# Patient Record
Sex: Female | Born: 1940 | Race: White | Hispanic: No | Smoking: Former smoker
Health system: Southern US, Community
[De-identification: ages and names within clinical notes are randomized; demographics above are authoritative.]

---

## 2019-09-23 ENCOUNTER — Inpatient Hospital Stay (HOSPITAL_COMMUNITY)
Admission: EM | Admit: 2019-09-23 | Discharge: 2019-10-01 | DRG: 482 | Disposition: A | Discharge status: Skilled Nursing Facility | Payer: Medicare PPO | Attending: Internal Medicine | Admitting: Internal Medicine

## 2019-09-23 ENCOUNTER — Emergency Department (HOSPITAL_COMMUNITY): Payer: Medicare PPO

## 2019-09-23 ENCOUNTER — Other Ambulatory Visit: Payer: Self-pay

## 2019-09-23 DIAGNOSIS — Z79899 Other long term (current) drug therapy: Secondary | ICD-10-CM

## 2019-09-23 DIAGNOSIS — D72829 Elevated white blood cell count, unspecified: Secondary | ICD-10-CM | POA: Diagnosis not present

## 2019-09-23 DIAGNOSIS — Z20822 Contact with and (suspected) exposure to covid-19: Secondary | ICD-10-CM | POA: Diagnosis present

## 2019-09-23 DIAGNOSIS — J449 Chronic obstructive pulmonary disease, unspecified: Secondary | ICD-10-CM

## 2019-09-23 DIAGNOSIS — W010XXA Fall on same level from slipping, tripping and stumbling without subsequent striking against object, initial encounter: Secondary | ICD-10-CM | POA: Diagnosis present

## 2019-09-23 DIAGNOSIS — S72002A Fracture of unspecified part of neck of left femur, initial encounter for closed fracture: Secondary | ICD-10-CM

## 2019-09-23 DIAGNOSIS — Z87891 Personal history of nicotine dependence: Secondary | ICD-10-CM

## 2019-09-23 DIAGNOSIS — D539 Nutritional anemia, unspecified: Secondary | ICD-10-CM | POA: Diagnosis not present

## 2019-09-23 DIAGNOSIS — Z7951 Long term (current) use of inhaled steroids: Secondary | ICD-10-CM | POA: Diagnosis not present

## 2019-09-23 DIAGNOSIS — I951 Orthostatic hypotension: Secondary | ICD-10-CM | POA: Diagnosis not present

## 2019-09-23 DIAGNOSIS — M25552 Pain in left hip: Secondary | ICD-10-CM | POA: Diagnosis present

## 2019-09-23 DIAGNOSIS — F419 Anxiety disorder, unspecified: Secondary | ICD-10-CM

## 2019-09-23 DIAGNOSIS — S72145A Nondisplaced intertrochanteric fracture of left femur, initial encounter for closed fracture: Principal | ICD-10-CM | POA: Diagnosis present

## 2019-09-23 DIAGNOSIS — Z85048 Personal history of other malignant neoplasm of rectum, rectosigmoid junction, and anus: Secondary | ICD-10-CM

## 2019-09-23 DIAGNOSIS — M810 Age-related osteoporosis without current pathological fracture: Secondary | ICD-10-CM | POA: Diagnosis present

## 2019-09-23 DIAGNOSIS — R197 Diarrhea, unspecified: Secondary | ICD-10-CM | POA: Diagnosis not present

## 2019-09-23 DIAGNOSIS — J431 Panlobular emphysema: Secondary | ICD-10-CM | POA: Diagnosis not present

## 2019-09-23 DIAGNOSIS — Y92009 Unspecified place in unspecified non-institutional (private) residence as the place of occurrence of the external cause: Secondary | ICD-10-CM | POA: Diagnosis not present

## 2019-09-23 DIAGNOSIS — S72009A Fracture of unspecified part of neck of unspecified femur, initial encounter for closed fracture: Secondary | ICD-10-CM | POA: Diagnosis present

## 2019-09-23 DIAGNOSIS — Z8781 Personal history of (healed) traumatic fracture: Secondary | ICD-10-CM

## 2019-09-23 LAB — CBC WITH DIFFERENTIAL/PLATELET
Abs Immature Granulocytes: 0.04 10*3/uL (ref 0.00–0.07)
Basophils Absolute: 0.1 10*3/uL (ref 0.0–0.1)
Basophils Relative: 1 %
Eosinophils Absolute: 0.4 10*3/uL (ref 0.0–0.5)
Eosinophils Relative: 4 %
HCT: 34.6 % — ABNORMAL LOW (ref 36.0–46.0)
Hemoglobin: 10.6 g/dL — ABNORMAL LOW (ref 12.0–15.0)
Immature Granulocytes: 0 %
Lymphocytes Relative: 9 %
Lymphs Abs: 1 10*3/uL (ref 0.7–4.0)
MCH: 30.7 pg (ref 26.0–34.0)
MCHC: 30.6 g/dL (ref 30.0–36.0)
MCV: 100.3 fL — ABNORMAL HIGH (ref 80.0–100.0)
Monocytes Absolute: 0.6 10*3/uL (ref 0.1–1.0)
Monocytes Relative: 6 %
Neutro Abs: 8.9 10*3/uL — ABNORMAL HIGH (ref 1.7–7.7)
Neutrophils Relative %: 80 %
Platelets: 330 10*3/uL (ref 150–400)
RBC: 3.45 MIL/uL — ABNORMAL LOW (ref 3.87–5.11)
RDW: 13.4 % (ref 11.5–15.5)
WBC: 11 10*3/uL — ABNORMAL HIGH (ref 4.0–10.5)
nRBC: 0 % (ref 0.0–0.2)

## 2019-09-23 LAB — BASIC METABOLIC PANEL
Anion gap: 11 (ref 5–15)
BUN: 10 mg/dL (ref 8–23)
CO2: 25 mmol/L (ref 22–32)
Calcium: 9 mg/dL (ref 8.9–10.3)
Chloride: 107 mmol/L (ref 98–111)
Creatinine, Ser: 0.69 mg/dL (ref 0.44–1.00)
GFR calc Af Amer: >60 mL/min (ref >60)
GFR calc non Af Amer: >60 mL/min (ref >60)
Glucose, Bld: 116 mg/dL — ABNORMAL HIGH (ref 70–99)
Potassium: 3.5 mmol/L (ref 3.5–5.1)
Sodium: 143 mmol/L (ref 135–145)

## 2019-09-23 LAB — PROTIME-INR
INR: 0.9 (ref 0.8–1.2)
Prothrombin Time: 11.7 seconds (ref 11.4–15.2)

## 2019-09-23 MED ORDER — FENTANYL CITRATE (PF) 100 MCG/2ML IJ SOLN
100.0000 ug | Freq: Once | INTRAMUSCULAR | Status: AC
  Administered 2019-09-23: 100 ug via INTRAVENOUS
  Filled 2019-09-23: qty 2

## 2019-09-23 MED ORDER — ONDANSETRON HCL 4 MG/2ML IJ SOLN
4.0000 mg | Freq: Once | INTRAMUSCULAR | Status: DC
  Filled 2019-09-23: qty 2

## 2019-09-23 MED ORDER — MORPHINE SULFATE (PF) 2 MG/ML IV SOLN: 0.5000 mg | INTRAVENOUS | Status: DC | PRN

## 2019-09-23 MED ORDER — SODIUM CHLORIDE 0.9 % IV SOLN: INTRAVENOUS | Status: DC

## 2019-09-23 MED ORDER — IBUPROFEN 200 MG PO TABS
600.0000 mg | ORAL_TABLET | Freq: Once | ORAL | Status: AC
  Administered 2019-09-24: 600 mg via ORAL
  Filled 2019-09-23: qty 3

## 2019-09-23 MED ORDER — HYDROCODONE-ACETAMINOPHEN 5-325 MG PO TABS: 1.0000 | ORAL_TABLET | Freq: Four times a day (QID) | ORAL | Status: DC | PRN

## 2019-09-23 MED ORDER — FENTANYL CITRATE (PF) 100 MCG/2ML IJ SOLN: 50.0000 ug | INTRAMUSCULAR | Status: DC | PRN

## 2019-09-23 MED ORDER — HYDROCODONE-ACETAMINOPHEN 5-325 MG PO TABS
1.0000 | ORAL_TABLET | Freq: Four times a day (QID) | ORAL | Status: DC
  Administered 2019-09-24 – 2019-09-29 (×21): 1 via ORAL
  Filled 2019-09-23 (×23): qty 1

## 2019-09-23 MED ORDER — SENNA 8.6 MG PO TABS
1.0000 | ORAL_TABLET | Freq: Two times a day (BID) | ORAL | Status: DC
  Administered 2019-09-24 – 2019-09-27 (×6): 8.6 mg via ORAL
  Filled 2019-09-23 (×6): qty 1

## 2019-09-23 NOTE — ED Notes (Signed)
Report attempted x 1

## 2019-09-23 NOTE — ED Notes (Signed)
Pt. Transported to Xray 

## 2019-09-23 NOTE — ED Triage Notes (Signed)
Pt bib [facility] from home after slipping on a pinecone outside her home. Pt denies hitting head or loc. Pt not on thinners. Pt vs stable.

## 2019-09-23 NOTE — H&P (Signed)
History and Physical    Diana Potts QHU:[id number] DOB: 05-27-1940 DOA: 09/23/2019  PCP: Patient, No Pcp Per  Patient coming from: Home, lives with son who is disabled from stroke  I have personally briefly reviewed patient's old medical records in [facility]  Chief Complaint: Mechanical fall  HPI: Diana Potts is a 79 y.o. female with medical history significant for hx of anal cancer, COPD, osteoporosis, anxiety who presents following a mechanical fall.  Patient was taking her usual walk today when she suddenly felt like her feet rolled on something and she fell on her .  She was otherwise in her normal state of health and denies any dizziness or lightheadedness.  No chest pain or palpitations.  She was unable to get up on her own and laid on the ground about 3 to 4 minutes until she got help from someone driving by. Does not have any hx of recurrent falls. Does not normally need assistance with ambulation.  Mild leukocytosis of 11, macrocytic anemia with hemoglobin of 10.6 and no prior for comparison. BMP otherwise unremarkable.  Pelvic x-ray shows acute intertrochanteric fracture of the left femur with displacement and angulation.  She was given 100 mcg of fentanyl and 4 mg of Zofran in the ED.   Review of Systems:  Constitutional: No Weight Change, No Fever ENT/Mouth: No sore throat, No Rhinorrhea Eyes: No Eye Pain, No Vision Changes Cardiovascular: No Chest Pain, no SOB,  No Palpitations Respiratory: No Cough, No Sputum, Gastrointestinal: No Nausea, No Vomiting, No Diarrhea, No Constipation, No Pain Genitourinary: no Urinary Incontinence Musculoskeletal: No Arthralgias, No Myalgias Skin: No Skin Lesions, No Pruritus, Neuro: no Weakness, No Numbness Psych: No Anxiety/Panic, No Depression, no decrease appetite Heme/Lymph: No Bruising, No Bleeding    Prior to Admission medications   Not on File    Physical Exam: Vitals:   09/23/19 2007 09/23/19 2007 09/23/19  2215 09/23/19 2245  BP:   (!) 142/83 (!) 143/73  Pulse:   63 63  Resp:   16 14  Temp:      TempSrc:      SpO2:  97% 100% 99%  Weight: 52.2 kg     Height: 5\' 6"  (1.676 m)       Constitutional: NAD, calm, comfortable, elderly female lying flat in bed Vitals:   09/23/19 2007 09/23/19 2007 09/23/19 2215 09/23/19 2245  BP:   (!) 142/83 (!) 143/73  Pulse:   63 63  Resp:   16 14  Temp:      TempSrc:      SpO2:  97% 100% 99%  Weight: 52.2 kg     Height: 5\' 6"  (1.676 m)      Eyes: PERRL, lids and conjunctivae normal ENMT: Mucous membranes are moist. Neck: normal, supple Respiratory: clear to auscultation bilaterally, no wheezing, no crackles. Normal respiratory effort. No accessory muscle use.  Cardiovascular: Regular rate and rhythm, no murmurs / rubs / gallops. No extremity edema. 2+ pedal pulses.  Abdomen: no tenderness, no masses palpated.  Bowel sounds positive.  Musculoskeletal: no clubbing / cyanosis. No joint deformity upper and lower extremities.  Externally rotated and shortened left lower extremity compared to the right. Skin: no rashes, lesions, ulcers. No induration Neurologic: CN 2-12 grossly intact. Sensation intact.  5 out of 5 strength of the right lower extremity but unable to left left lower extremity due to pain tolerance. Psychiatric: Normal judgment and insight. Alert and oriented x 3. Normal mood.    Labs on  Admission: I have personally reviewed following labs and imaging studies  CBC: Recent Labs  Lab 09/23/19 2141  WBC 11.0*  NEUTROABS 8.9*  HGB 10.6*  HCT 34.6*  MCV 100.3*  PLT 330   Basic Metabolic Panel: Recent Labs  Lab 09/23/19 2141  NA 143  K 3.5  CL 107  CO2 25  GLUCOSE 116*  BUN 10  CREATININE 0.69  CALCIUM 9.0   GFR: Estimated Creatinine Clearance: 47.8 mL/min (by C-G formula based on SCr of 0.69 mg/dL). Liver Function Tests: No results for input(s): AST, ALT, ALKPHOS, BILITOT, PROT, ALBUMIN in the last 168 hours. No results  for input(s): LIPASE, AMYLASE in the last 168 hours. No results for input(s): AMMONIA in the last 168 hours. Coagulation Profile: Recent Labs  Lab 09/23/19 2141  INR 0.9   Cardiac Enzymes: No results for input(s): CKTOTAL, CKMB, CKMBINDEX, TROPONINI in the last 168 hours. BNP (last 3 results) No results for input(s): PROBNP in the last 8760 hours. HbA1C: No results for input(s): HGBA1C in the last 72 hours. CBG: No results for input(s): GLUCAP in the last 168 hours. Lipid Profile: No results for input(s): CHOL, HDL, LDLCALC, TRIG, CHOLHDL, LDLDIRECT in the last 72 hours. Thyroid Function Tests: No results for input(s): TSH, T4TOTAL, FREET4, T3FREE, THYROIDAB in the last 72 hours. Anemia Panel: No results for input(s): VITAMINB12, FOLATE, FERRITIN, TIBC, IRON, RETICCTPCT in the last 72 hours. Urine analysis: No results found for: COLORURINE, APPEARANCEUR, LABSPEC, PHURINE, GLUCOSEU, HGBUR, BILIRUBINUR, KETONESUR, PROTEINUR, UROBILINOGEN, NITRITE, LEUKOCYTESUR  Radiological Exams on Admission: DG Hip Unilat W or Wo Pelvis 2-3 Views Left  Result Date: 09/23/2019 CLINICAL DATA:  Fall, hip pain EXAM: DG HIP (WITH OR WITHOUT PELVIS) 2-3V LEFT COMPARISON:  None. FINDINGS: Acute intertrochanteric fracture of the left femur with displacement and varus angulation. No dislocation. IMPRESSION: Acute intertrochanteric fracture of the left femur with displacement and angulation. Electronically Signed   By: Guadlupe Spanish M.D.   On: 09/23/2019 20:53      Assessment/Plan  Left intertrochanteric fracture s/p mechanical fall ortho to take for IM nailing tomorrow Keep NPO Scheduled hydrocodone-acetaminophen, PRN IV morphine for severe pain   Macrocytic anemia Hemoglobin 10.6 with no prior for comparison Check iron panel, vitamin B12 and folate Patient denies any dark stools but does have a history of anal cancer.  Will check FOBT  COPD Not in acute exacerbation.  Continue  bronchodilator.  Anxiety Continue Prozac  DVT prophylaxis: SCDs Code Status: Full Family Communication: Plan discussed with patient at bedside  disposition Plan: Home with at least 2 midnight stays  Consults called: Ortho Admission status: inpatient  Status is: Inpatient  Remains inpatient appropriate because:Inpatient level of care appropriate due to severity of illness   Dispo: The patient is from: Home              Anticipated d/c is to: TBD              Anticipated d/c date is: 3 days              Patient currently is not medically stable to d/c.         Anselm Jungling DO [facility]   If 7PM-7AM, please contact night-coverage [email or web address]   09/23/2019, 11:55 PM

## 2019-09-23 NOTE — Consult Note (Signed)
I have reviewed imaging. Plan for IM nail L hip in the morning after medical work up.   Formal consult to follow.    Sheral Apley

## 2019-09-24 ENCOUNTER — Encounter (HOSPITAL_COMMUNITY): Payer: Self-pay | Admitting: Family Medicine

## 2019-09-24 ENCOUNTER — Inpatient Hospital Stay (HOSPITAL_COMMUNITY): Payer: Medicare PPO | Admitting: Certified Registered"

## 2019-09-24 ENCOUNTER — Inpatient Hospital Stay (HOSPITAL_COMMUNITY): Payer: Medicare PPO

## 2019-09-24 ENCOUNTER — Encounter (HOSPITAL_COMMUNITY)
Admission: EM | Disposition: A | Discharge status: Skilled Nursing Facility | Payer: Self-pay | Source: Home / Self Care | Attending: Internal Medicine

## 2019-09-24 DIAGNOSIS — J449 Chronic obstructive pulmonary disease, unspecified: Secondary | ICD-10-CM

## 2019-09-24 DIAGNOSIS — J431 Panlobular emphysema: Secondary | ICD-10-CM

## 2019-09-24 DIAGNOSIS — F419 Anxiety disorder, unspecified: Secondary | ICD-10-CM

## 2019-09-24 DIAGNOSIS — D539 Nutritional anemia, unspecified: Secondary | ICD-10-CM

## 2019-09-24 HISTORY — PX: INTRAMEDULLARY (IM) NAIL INTERTROCHANTERIC: SHX5875

## 2019-09-24 LAB — FOLATE: Folate: 13.2 ng/mL (ref >5.9)

## 2019-09-24 LAB — IRON AND TIBC
Iron: 72 ug/dL (ref 28–170)
Saturation Ratios: 25 % (ref 10.4–31.8)
TIBC: 284 ug/dL (ref 250–450)
UIBC: 212 ug/dL

## 2019-09-24 LAB — SARS CORONAVIRUS 2 BY RT PCR (HOSPITAL ORDER, PERFORMED IN ~~LOC~~ HOSPITAL LAB): SARS Coronavirus 2: NEGATIVE

## 2019-09-24 LAB — TYPE AND SCREEN
ABO/RH(D): A POS
ABO/RH(D): A POS
Antibody Screen: NEGATIVE
Antibody Screen: NEGATIVE

## 2019-09-24 LAB — MRSA PCR SCREENING: MRSA by PCR: NEGATIVE

## 2019-09-24 LAB — VITAMIN B12: Vitamin B-12: 6371 pg/mL — ABNORMAL HIGH (ref 180–914)

## 2019-09-24 SURGERY — FIXATION, FRACTURE, INTERTROCHANTERIC, WITH INTRAMEDULLARY ROD
Anesthesia: Spinal | Site: Hip | Laterality: Left

## 2019-09-24 MED ORDER — MENTHOL 3 MG MT LOZG
1.0000 | LOZENGE | OROMUCOSAL | Status: DC | PRN
  Administered 2019-09-24: 3 mg via ORAL
  Filled 2019-09-24 (×2): qty 9

## 2019-09-24 MED ORDER — TRANEXAMIC ACID-NACL 1000-0.7 MG/100ML-% IV SOLN
INTRAVENOUS | Status: AC
  Filled 2019-09-24: qty 100

## 2019-09-24 MED ORDER — PHENYLEPHRINE HCL-NACL 10-0.9 MG/250ML-% IV SOLN
INTRAVENOUS | Status: DC | PRN
  Administered 2019-09-24: 20 ug/min via INTRAVENOUS

## 2019-09-24 MED ORDER — CEFAZOLIN SODIUM-DEXTROSE 2-4 GM/100ML-% IV SOLN
2.0000 g | Freq: Once | INTRAVENOUS | Status: AC
  Administered 2019-09-24: 2 g via INTRAVENOUS

## 2019-09-24 MED ORDER — CEFAZOLIN SODIUM-DEXTROSE 2-4 GM/100ML-% IV SOLN
INTRAVENOUS | Status: AC
  Filled 2019-09-24: qty 100

## 2019-09-24 MED ORDER — FENTANYL CITRATE (PF) 250 MCG/5ML IJ SOLN
INTRAMUSCULAR | Status: DC | PRN
  Administered 2019-09-24: 50 ug via INTRAVENOUS

## 2019-09-24 MED ORDER — ONDANSETRON HCL 4 MG/2ML IJ SOLN: 4.0000 mg | Freq: Once | INTRAMUSCULAR | Status: DC | PRN

## 2019-09-24 MED ORDER — MIDAZOLAM HCL 2 MG/2ML IJ SOLN
INTRAMUSCULAR | Status: AC
  Filled 2019-09-24: qty 2

## 2019-09-24 MED ORDER — SODIUM CHLORIDE 0.9 % IR SOLN
Status: DC | PRN
  Administered 2019-09-24: 1000 mL

## 2019-09-24 MED ORDER — LACTATED RINGERS IV SOLN: INTRAVENOUS | Status: DC

## 2019-09-24 MED ORDER — LORAZEPAM 0.5 MG PO TABS
0.2500 mg | ORAL_TABLET | Freq: Two times a day (BID) | ORAL | Status: DC | PRN
  Administered 2019-09-27 – 2019-09-30 (×2): 0.25 mg via ORAL
  Filled 2019-09-24 (×2): qty 1

## 2019-09-24 MED ORDER — MOMETASONE FURO-FORMOTEROL FUM 200-5 MCG/ACT IN AERO
2.0000 | INHALATION_SPRAY | Freq: Two times a day (BID) | RESPIRATORY_TRACT | Status: DC
  Administered 2019-09-24 – 2019-10-01 (×13): 2 via RESPIRATORY_TRACT
  Filled 2019-09-24: qty 8.8

## 2019-09-24 MED ORDER — PROPOFOL 500 MG/50ML IV EMUL
INTRAVENOUS | Status: DC | PRN
Start: 2019-09-24 — End: 2019-09-24
  Administered 2019-09-24: 25 ug/kg/min via INTRAVENOUS

## 2019-09-24 MED ORDER — ALBUTEROL SULFATE (2.5 MG/3ML) 0.083% IN NEBU: 3.0000 mL | INHALATION_SOLUTION | Freq: Four times a day (QID) | RESPIRATORY_TRACT | Status: DC | PRN

## 2019-09-24 MED ORDER — MIDAZOLAM HCL 2 MG/2ML IJ SOLN
INTRAMUSCULAR | Status: DC | PRN
  Administered 2019-09-24: 1 mg via INTRAVENOUS

## 2019-09-24 MED ORDER — FLUOXETINE HCL 10 MG PO CAPS
10.0000 mg | ORAL_CAPSULE | Freq: Every day | ORAL | Status: DC
  Administered 2019-09-25 – 2019-09-30 (×6): 10 mg via ORAL
  Filled 2019-09-24 (×8): qty 1

## 2019-09-24 MED ORDER — PHENYLEPHRINE 40 MCG/ML (10ML) SYRINGE FOR IV PUSH (FOR BLOOD PRESSURE SUPPORT)
PREFILLED_SYRINGE | INTRAVENOUS | Status: DC | PRN
  Administered 2019-09-24 (×2): 80 ug via INTRAVENOUS

## 2019-09-24 MED ORDER — VITAMIN D 25 MCG (1000 UNIT) PO TABS
2000.0000 [IU] | ORAL_TABLET | Freq: Every day | ORAL | Status: DC
  Administered 2019-09-25 – 2019-10-01 (×7): 2000 [IU] via ORAL
  Filled 2019-09-24 (×7): qty 2

## 2019-09-24 MED ORDER — FENTANYL CITRATE (PF) 100 MCG/2ML IJ SOLN: 25.0000 ug | INTRAMUSCULAR | Status: DC | PRN

## 2019-09-24 MED ORDER — TRANEXAMIC ACID-NACL 1000-0.7 MG/100ML-% IV SOLN
INTRAVENOUS | Status: DC | PRN
Start: 2019-09-24 — End: 2019-09-24
  Administered 2019-09-24: 1000 mg via INTRAVENOUS

## 2019-09-24 MED ORDER — IBUPROFEN 200 MG PO TABS
600.0000 mg | ORAL_TABLET | Freq: Four times a day (QID) | ORAL | Status: DC | PRN
  Administered 2019-09-24 – 2019-09-29 (×4): 600 mg via ORAL
  Filled 2019-09-24 (×5): qty 3

## 2019-09-24 MED ORDER — CHLORHEXIDINE GLUCONATE 0.12 % MT SOLN
15.0000 mL | Freq: Once | OROMUCOSAL | Status: AC
  Administered 2019-09-24: 15 mL via OROMUCOSAL

## 2019-09-24 MED ORDER — PROPOFOL 10 MG/ML IV BOLUS
INTRAVENOUS | Status: DC | PRN
  Administered 2019-09-24 (×2): 20 mg via INTRAVENOUS
  Administered 2019-09-24: 10 mg via INTRAVENOUS

## 2019-09-24 MED ORDER — PROPOFOL 10 MG/ML IV BOLUS
INTRAVENOUS | Status: AC
  Filled 2019-09-24: qty 20

## 2019-09-24 MED ORDER — MEPERIDINE HCL 25 MG/ML IJ SOLN: 6.2500 mg | INTRAMUSCULAR | Status: DC | PRN

## 2019-09-24 MED ORDER — FENTANYL CITRATE (PF) 250 MCG/5ML IJ SOLN
INTRAMUSCULAR | Status: AC
  Filled 2019-09-24: qty 5

## 2019-09-24 MED ORDER — BUPIVACAINE IN DEXTROSE 0.75-8.25 % IT SOLN
INTRATHECAL | Status: DC | PRN
Start: 2019-09-24 — End: 2019-09-24
  Administered 2019-09-24: 1.6 mL via INTRATHECAL

## 2019-09-24 MED ORDER — MONTELUKAST SODIUM 10 MG PO TABS
10.0000 mg | ORAL_TABLET | Freq: Every day | ORAL | Status: DC
  Administered 2019-09-24 – 2019-09-30 (×7): 10 mg via ORAL
  Filled 2019-09-24 (×7): qty 1

## 2019-09-24 SURGICAL SUPPLY — 39 items
BIT DRILL AO GAMMA 4.2X180 (BIT) ×3 IMPLANT
CLOSURE STERI-STRIP 1/2X4 (GAUZE/BANDAGES/DRESSINGS) ×1
CLOSURE WOUND 1/2 X4 (GAUZE/BANDAGES/DRESSINGS) ×1
CLSR STERI-STRIP ANTIMIC 1/2X4 (GAUZE/BANDAGES/DRESSINGS) ×2 IMPLANT
COVER MAYO STAND STRL (DRAPES) ×3 IMPLANT
COVER PERINEAL POST (MISCELLANEOUS) ×3 IMPLANT
COVER SURGICAL LIGHT HANDLE (MISCELLANEOUS) ×3 IMPLANT
COVER WAND RF STERILE (DRAPES) IMPLANT
DRAPE INCISE IOBAN 66X45 STRL (DRAPES) ×3 IMPLANT
DRAPE STERI IOBAN 125X83 (DRAPES) ×3 IMPLANT
DRSG MEPILEX BORDER 4X4 (GAUZE/BANDAGES/DRESSINGS) ×6 IMPLANT
DURAPREP 26ML APPLICATOR (WOUND CARE) ×3 IMPLANT
ELECT REM PT RETURN 9FT ADLT (ELECTROSURGICAL) ×3
ELECTRODE REM PT RTRN 9FT ADLT (ELECTROSURGICAL) ×1 IMPLANT
GLOVE BIO SURGEON STRL SZ7.5 (GLOVE) ×6 IMPLANT
GLOVE BIOGEL PI IND STRL 8 (GLOVE) ×2 IMPLANT
GLOVE BIOGEL PI INDICATOR 8 (GLOVE) ×4
GOWN STRL REUS W/ TWL LRG LVL3 (GOWN DISPOSABLE) ×2 IMPLANT
GOWN STRL REUS W/TWL LRG LVL3 (GOWN DISPOSABLE) ×4
GUIDEROD T2 3X1000 (ROD) ×3 IMPLANT
K-WIRE  3.2X450M STR (WIRE) ×4
K-WIRE 3.2X450M STR (WIRE) ×2
KIT BASIN OR (CUSTOM PROCEDURE TRAY) ×3 IMPLANT
KIT TURNOVER KIT B (KITS) ×3 IMPLANT
KWIRE 3.2X450M STR (WIRE) ×2 IMPLANT
MANIFOLD NEPTUNE II (INSTRUMENTS) IMPLANT
NAIL LONG RT 1.5 10X340-125 (Nail) ×3 IMPLANT
NS IRRIG 1000ML POUR BTL (IV SOLUTION) ×3 IMPLANT
PACK GENERAL/GYN (CUSTOM PROCEDURE TRAY) ×3 IMPLANT
PAD ARMBOARD 7.5X6 YLW CONV (MISCELLANEOUS) ×6 IMPLANT
SCREW LAG GAMMA 3 95MM (Screw) ×3 IMPLANT
STRIP CLOSURE SKIN 1/2X4 (GAUZE/BANDAGES/DRESSINGS) ×2 IMPLANT
SUT MNCRL AB 4-0 PS2 18 (SUTURE) ×3 IMPLANT
SUT MON AB 2-0 CT1 36 (SUTURE) ×3 IMPLANT
SUT VIC AB 2-0 CT1 27 (SUTURE) ×2
SUT VIC AB 2-0 CT1 TAPERPNT 27 (SUTURE) ×1 IMPLANT
TOWEL GREEN STERILE (TOWEL DISPOSABLE) ×3 IMPLANT
TOWEL OR NON WOVEN STRL DISP B (DISPOSABLE) IMPLANT
WATER STERILE IRR 1000ML POUR (IV SOLUTION) ×3 IMPLANT

## 2019-09-24 NOTE — Op Note (Signed)
DATE OF SURGERY:  09/24/2019  TIME: 10:47 AM  PATIENT NAME:  Diana Potts  AGE: 79 y.o.  PRE-OPERATIVE DIAGNOSIS:  LEFT INTERTROCHANTERIC FRACTURE  POST-OPERATIVE DIAGNOSIS:  SAME  PROCEDURE:  INTRAMEDULLARY (IM) NAIL, HIP  SURGEON:  Sheral Apley  ASSISTANT:  none.   OPERATIVE IMPLANTS: Stryker Gamma Nail  PREOPERATIVE INDICATIONS:  [name] is a 79 y.o. year old who fell and suffered a hip fracture. She was brought into the ER and then admitted and optimized and then elected for surgical intervention.    The risks benefits and alternatives were discussed with the patient including but not limited to the risks of nonoperative treatment, versus surgical intervention including infection, bleeding, nerve injury, malunion, nonunion, hardware prominence, hardware failure, need for hardware removal, blood clots, cardiopulmonary complications, morbidity, mortality, among others, and they were willing to proceed.    OPERATIVE PROCEDURE:  The patient was brought to the operating room and placed in the supine position. General anesthesia was administered. She was placed on the fracture table.  Closed reduction was performed under C-arm guidance. Time out was then performed after sterile prep and drape. She received preoperative antibiotics.  Incision was made proximal to the greater trochanter. A guidewire was placed in the appropriate position. Confirmation was made on AP and lateral views. The above-named nail was opened. I opened the proximal femur with a reamer. I then placed the nail by hand easily down. I did not need to ream the femur.  Once the nail was completely seated, I placed a guidepin into the femoral head into the center center position. I measured the length, and then reamed the lateral cortex and up into the head. I then placed the lag screw. Slight compression was applied. Anatomic fixation achieved. Bone quality was mediocre.  I then secured the proximal  interlocking bolt, and took off a half a turn, and then removed the instruments, and took final C-arm pictures AP and lateral the entire length of the leg.   Anatomic reconstruction was achieved, and the wounds were irrigated copiously and closed with Vicryl followed by staples and sterile gauze for the skin. The patient was awakened and returned to PACU in stable and satisfactory condition. There no complications and the patient tolerated the procedure well.  She will be weightbearing as tolerated, and will be on chemical px  for a period of four weeks after discharge.   Margarita Rana, M.D.

## 2019-09-24 NOTE — Anesthesia Preprocedure Evaluation (Signed)
Anesthesia Evaluation  Patient identified by MRN, date of birth, ID band Patient awake    Reviewed: Allergy & Precautions, NPO status , Patient's Chart, lab work & pertinent test results  Airway Mallampati: II  TM Distance: >3 FB Neck ROM: Full    Dental no notable dental hx. (+) Dental Advisory Given, Teeth Intact   Pulmonary COPD, former smoker,    Pulmonary exam normal breath sounds clear to auscultation       Cardiovascular negative cardio ROS Normal cardiovascular exam Rhythm:Regular Rate:Normal     Neuro/Psych negative neurological ROS  negative psych ROS   GI/Hepatic negative GI ROS, Neg liver ROS,   Endo/Other  negative endocrine ROS  Renal/GU negative Renal ROS     Musculoskeletal negative musculoskeletal ROS (+)   Abdominal   Peds  Hematology negative hematology ROS (+) anemia ,   Anesthesia Other Findings   Reproductive/Obstetrics negative OB ROS                             Anesthesia Physical Anesthesia Plan  ASA: III  Anesthesia Plan: Spinal   Post-op Pain Management:    Induction: Intravenous  PONV Risk Score and Plan: 3 and Propofol infusion, TIVA and Treatment may vary due to age or medical condition  Airway Management Planned: Natural Airway  Additional Equipment: None  Intra-op Plan:   Post-operative Plan:   Informed Consent: I have reviewed the patients History and Physical, chart, labs and discussed the procedure including the risks, benefits and alternatives for the proposed anesthesia with the patient or authorized representative who has indicated his/her understanding and acceptance.     Dental advisory given  Plan Discussed with: CRNA  Anesthesia Plan Comments:         Anesthesia Quick Evaluation

## 2019-09-24 NOTE — Anesthesia Procedure Notes (Signed)
Procedure Name: MAC Date/Time: 09/24/2019 9:37 AM Performed by: Amadeo Garnet, CRNA Pre-anesthesia Checklist: Patient identified, Emergency Drugs available, Suction available and Patient being monitored Patient Re-evaluated:Patient Re-evaluated prior to induction Oxygen Delivery Method: Simple face mask Preoxygenation: Pre-oxygenation with 100% oxygen Induction Type: IV induction Placement Confirmation: positive ETCO2 Dental Injury: Teeth and Oropharynx as per pre-operative assessment

## 2019-09-24 NOTE — Transfer of Care (Signed)
Immediate Anesthesia Transfer of Care Note  Patient: Diana Potts  Procedure(s) Performed: INTRAMEDULLARY (IM) NAIL, HIP (Left Hip)  Patient Location: PACU  Anesthesia Type:Spinal  Level of Consciousness: awake, alert  and oriented  Airway & Oxygen Therapy: Patient Spontanous Breathing  Post-op Assessment: Report given to RN and Post -op Vital signs reviewed and stable  Post vital signs: Reviewed and stable  Last Vitals:  Vitals Value Taken Time  BP 105/89 09/24/19 1050  Temp    Pulse 93 09/24/19 1052  Resp 16 09/24/19 1052  SpO2 99 % 09/24/19 1052  Vitals shown include unvalidated device data.  Last Pain:  Vitals:   09/24/19 0356  TempSrc: Oral  PainSc:          Complications: No complications documented.

## 2019-09-24 NOTE — ED Provider Notes (Signed)
[facility] Provider Note   CSN: [id number] Arrival date & time: 09/23/19  1856     History Chief Complaint  Patient presents with  . Hip Pain    left     Diana Potts is a 79 y.o. female.  HPI      79yo female presents with concern for fall and hip pain.  Reports walking outside and believes she slipped on a pine cone and fell.  Fell directly on left hip with severe pain. Pain 10/10 with movement. 2/10 at rest.  Denies head trauma, headache, neck pain, back pain, numbness/weakness. Not on anticoagulation.  No other medical concerns.  History reviewed. No pertinent past medical history.  Patient Active Problem List   Diagnosis Date Noted  . Macrocytic anemia 09/24/2019  . COPD (chronic obstructive pulmonary disease) (HCC) 09/24/2019  . Anxiety 09/24/2019  . Hip fracture (HCC) 09/23/2019     OB History   No obstetric history on file.     No family history on file.  Social History   Tobacco Use  . Smoking status: Former Games developer  . Smokeless tobacco: Never Used  Substance Use Topics  . Alcohol use: Not Currently  . Drug use: Not on file    Home Medications Prior to Admission medications   Medication Sig Start Date End Date Taking? Authorizing Provider  albuterol (VENTOLIN HFA) 108 (90 Base) MCG/ACT inhaler Inhale 1 puff into the lungs every 6 (six) hours as needed for wheezing or shortness of breath. 09/23/19  Yes [provider]  ALPHA-LIPOIC ACID PO Take 2 capsules by mouth daily.   Yes [provider]  Aspirin-Salicylamide-Caffeine (BC HEADACHE POWDER PO) Take 0.5 Packages by mouth daily as needed (Pain).   Yes [provider]  Cholecalciferol (VITAMIN D3 PO) Take 2,000-3,000 Units by mouth daily. Vitamin D3 Liquid   Yes [provider]  Co-Enzyme Q-10 100 MG CAPS Take 100 mg by mouth daily.   Yes [provider]  COLLAGEN PO Take 3 capsules by mouth daily.   Yes [provider]   Digestive Enzyme CAPS Take 1 capsule by mouth daily.   Yes [provider]  dorzolamide-timolol (COSOPT) 22.3-6.8 MG/ML ophthalmic solution Place 1 drop into both eyes 2 (two) times daily. 09/19/19  Yes [provider]  FLUoxetine (PROZAC) 10 MG capsule Take 10 mg by mouth at bedtime.   Yes [provider]  fluticasone (FLONASE) 50 MCG/ACT nasal spray Place 2 sprays into both nostrils daily as needed for allergies. 06/23/19  Yes [provider]  latanoprost (XALATAN) 0.005 % ophthalmic solution Place 1 drop into both eyes at bedtime. 09/06/19  Yes [provider]  LORazepam (ATIVAN) 0.5 MG tablet Take 0.25 mg by mouth 2 (two) times daily as needed for anxiety or sleep. 08/19/19  Yes [provider]  montelukast (SINGULAIR) 10 MG tablet Take 10 mg by mouth at bedtime. 04/19/19  Yes [provider]  phenylephrine (NEO-SYNEPHRINE) 1 % nasal spray Place 1 drop into both nostrils every 6 (six) hours as needed for congestion.   Yes [provider]  SYMBICORT 160-4.5 MCG/ACT inhaler Inhale 2 puffs into the lungs 2 (two) times daily. 09/23/19  Yes [provider]    Allergies    Patient has no known allergies.  Review of Systems   Review of Systems  Constitutional: Negative for fever.  HENT: Negative for sore throat.   Eyes: Negative for visual disturbance.  Respiratory: Negative for cough and shortness of  breath.   Cardiovascular: Negative for chest pain.  Gastrointestinal: Negative for abdominal pain, nausea and vomiting.  Genitourinary: Negative for difficulty urinating.  Musculoskeletal: Positive for arthralgias. Negative for back pain and neck pain.  Skin: Negative for rash.  Neurological: Negative for syncope, weakness, numbness and headaches.    Physical Exam Updated Vital Signs BP 116/60 (BP Location: Left Arm)   Pulse 67   Temp 98.4 F (36.9 C) (Oral)   Resp 16   Ht 5\' 6"  (1.676 m)   Wt 52.2 kg   SpO2  97%   BMI 18.56 kg/m   Physical Exam Vitals and nursing note reviewed.  Constitutional:      General: She is not in acute distress.    Appearance: She is well-developed. She is not diaphoretic.  HENT:     Head: Normocephalic and atraumatic.  Eyes:     Conjunctiva/sclera: Conjunctivae normal.  Cardiovascular:     Rate and Rhythm: Normal rate and regular rhythm.     Heart sounds: Normal heart sounds. No murmur heard.  No friction rub. No gallop.   Pulmonary:     Effort: Pulmonary effort is normal. No respiratory distress.     Breath sounds: Normal breath sounds. No wheezing or rales.  Abdominal:     General: There is no distension.     Palpations: Abdomen is soft.     Tenderness: There is no abdominal tenderness. There is no guarding.  Musculoskeletal:        General: Tenderness (decreased ROM L hip, normal movement of foot, normal sensation and pulses) present.     Cervical back: Normal range of motion.     Comments: Holding left leg in flexion  Skin:    General: Skin is warm and dry.     Findings: No erythema or rash.  Neurological:     Mental Status: She is alert and oriented to person, place, and time.     ED Results / Procedures / Treatments   Labs (all labs ordered are listed, but only abnormal results are displayed) Labs Reviewed  BASIC METABOLIC PANEL - Abnormal; Notable for the following components:      Result Value   Glucose, Bld 116 (*)    All other components within normal limits  CBC WITH DIFFERENTIAL/PLATELET - Abnormal; Notable for the following components:   WBC 11.0 (*)    RBC 3.45 (*)    Hemoglobin 10.6 (*)    HCT 34.6 (*)    MCV 100.3 (*)    Neutro Abs 8.9 (*)    All other components within normal limits  SARS CORONAVIRUS 2 BY RT PCR (HOSPITAL ORDER, PERFORMED IN [facility])  MRSA PCR SCREENING  PROTIME-INR  VITAMIN B12  FOLATE  IRON AND TIBC  OCCULT BLOOD X 1 CARD TO LAB, STOOL  TYPE AND SCREEN  TYPE AND SCREEN     EKG EKG Interpretation  Date/Time:  Friday September 23 2019 21:51:15 EDT Ventricular Rate:  63 PR Interval:    QRS Duration: 97 QT Interval:  416 QTC Calculation: 426 R Axis:   79 Text Interpretation: Sinus rhythm Normal ECG No old tracing to compare Confirmed by [id number] (Dione Booze) on 09/24/2019 12:12:29 AM   Radiology DG Hip Unilat W or Wo Pelvis 2-3 Views Left  Result Date: 09/23/2019 CLINICAL DATA:  Fall, hip pain EXAM: DG HIP (WITH OR WITHOUT PELVIS) 2-3V LEFT COMPARISON:  None. FINDINGS: Acute intertrochanteric fracture of the left femur with displacement and varus angulation. No  dislocation. IMPRESSION: Acute intertrochanteric fracture of the left femur with displacement and angulation. Electronically Signed   By: Guadlupe Spanish M.D.   On: 09/23/2019 20:53    Procedures Procedures (including critical care time)  Medications Ordered in ED Medications  ondansetron (ZOFRAN) injection 4 mg ( Intravenous MAR Hold 09/24/19 0743)  0.9 %  sodium chloride infusion ( Intravenous Rate/Dose Change 09/24/19 0100)  morphine 2 MG/ML injection 0.5 mg ( Intravenous MAR Hold 09/24/19 0743)  senna (SENOKOT) tablet 8.6 mg ( Oral Automatically Held 10/09/19 2200)  HYDROcodone-acetaminophen (NORCO/VICODIN) 5-325 MG per tablet 1 tablet ( Oral Automatically Held 10/02/19 1800)  FLUoxetine (PROZAC) capsule 10 mg ( Oral Automatically Held 10/02/19 2200)  LORazepam (ATIVAN) tablet 0.25 mg ( Oral MAR Hold 09/24/19 0743)  montelukast (SINGULAIR) tablet 10 mg ( Oral Automatically Held 10/02/19 2200)  mometasone-formoterol (DULERA) 200-5 MCG/ACT inhaler 2 puff ( Inhalation Automatically Held 10/02/19 2000)  albuterol (PROVENTIL) (2.5 MG/3ML) 0.083% nebulizer solution 3 mL ( Inhalation MAR Hold 09/24/19 0743)  cholecalciferol (VITAMIN D3) tablet 2,000 Units ( Oral Automatically Held 10/02/19 1000)  ceFAZolin (ANCEF) IVPB 2g/100 mL premix (2 g Intravenous Given 09/24/19 0938)  lactated ringers infusion ( Intravenous  Restarted 09/24/19 0925)  fentaNYL (SUBLIMAZE) injection 100 mcg (100 mcg Intravenous Given 09/23/19 2018)  ibuprofen (ADVIL) tablet 600 mg (600 mg Oral Given 09/24/19 0054)  chlorhexidine (PERIDEX) 0.12 % solution 15 mL (15 mLs Mouth/Throat Given 09/24/19 0804)  ceFAZolin (ANCEF) 2-4 GM/100ML-% IVPB (  Override pull for Anesthesia 09/24/19 0938)  midazolam (VERSED) 2 MG/2ML injection (  Override pull for Anesthesia 09/24/19 0933)    ED Course  I have reviewed the triage vital signs and the nursing notes.  Pertinent labs & imaging results that were available during my care of the patient were reviewed by me and considered in my medical decision making (see chart for details).    MDM Rules/Calculators/A&P                          79yo female presents with concern for fall and hip pain. No head trauma, no headache, not on anticoagulation, no neck or back pain, doubt intracranial or cervical spine injury.  Denies other injuries or medical concerns.  NV intact.  XR shows left intertrochanteric hip fracture. Consulted Dr. Eulah Pont of Orthopedics who plans on surgery for her tomorrow.  Consulted hospitalist for admission.    Final Clinical Impression(s) / ED Diagnoses Final diagnoses:  Closed nondisplaced intertrochanteric fracture of left femur, initial encounter [facility])    Rx / DC Orders ED Discharge Orders    None       Alvira Monday, MD 09/24/19 1005

## 2019-09-24 NOTE — Progress Notes (Signed)
Pt arrived from PACU accompanied by staff, Pt alert and oriented in no apparent distress. No complaints voiced. Pt situated to room call bell/room phone within reach.  3 side rails up and bed in lowest position and all wheels locked.

## 2019-09-24 NOTE — Plan of Care (Signed)
  Problem: Education: Goal: Knowledge of General Education information will improve Description: Including pain rating scale, medication(s)/side effects and non-pharmacologic comfort measures Outcome: Progressing   Problem: Health Behavior/Discharge Planning: Goal: Ability to manage health-related needs will improve Outcome: Progressing   Problem: Activity: Goal: Risk for activity intolerance will decrease Outcome: Progressing   

## 2019-09-24 NOTE — Consult Note (Signed)
   ORTHOPAEDIC CONSULTATION  REQUESTING PHYSICIAN: Ghimire, Kuber, MD  Chief Complaint: left intertroch fracture  HPI: Diana Potts is a 79 y.o. female who complains of Hip pain. I have reviewed and agree with below history  Diana Remigio is a 79 y.o. female with medical history significant for hx of anal cancer, COPD, osteoporosis, anxiety who presents following a mechanical fall.  Patient was taking her usual walk today when she suddenly felt like her feet rolled on something and she fell on her .  She was otherwise in her normal state of health and denies any dizziness or lightheadedness.  No chest pain or palpitations.  She was unable to get up on her own and laid on the ground about 3 to 4 minutes until she got help from someone driving by. Does not have any hx of recurrent falls. Does not normally need assistance with ambulation.  Mild leukocytosis of 11, macrocytic anemia with hemoglobin of 10.6 and no prior for comparison. BMP otherwise unremarkable.  Pelvic x-ray shows acute intertrochanteric fracture of the left femur with displacement and angulation.  History reviewed. No pertinent past medical history.  Social History   Socioeconomic History  . Marital status: Widowed    Spouse name: Not on file  . Number of children: Not on file  . Years of education: Not on file  . Highest education level: Not on file  Occupational History  . Not on file  Tobacco Use  . Smoking status: Former Smoker  . Smokeless tobacco: Never Used  Substance and Sexual Activity  . Alcohol use: Not Currently  . Drug use: Not on file  . Sexual activity: Not on file  Other Topics Concern  . Not on file  Social History Narrative  . Not on file   Social Determinants of Health   Financial Resource Strain:   . Difficulty of Paying Living Expenses:   Food Insecurity:   . Worried About Running Out of Food in the Last Year:   . Ran Out of Food in the Last Year:   Transportation Needs:    . Lack of Transportation (Medical):   . Lack of Transportation (Non-Medical):   Physical Activity:   . Days of Exercise per Week:   . Minutes of Exercise per Session:   Stress:   . Feeling of Stress :   Social Connections:   . Frequency of Communication with Friends and Family:   . Frequency of Social Gatherings with Friends and Family:   . Attends Religious Services:   . Active Member of Clubs or Organizations:   . Attends Club or Organization Meetings:   . Marital Status:    No family history on file. No Known Allergies Prior to Admission medications   Medication Sig Start Date End Date Taking? Authorizing Provider  albuterol (VENTOLIN HFA) 108 (90 Base) MCG/ACT inhaler Inhale 1 puff into the lungs every 6 (six) hours as needed for wheezing or shortness of breath. 09/23/19  Yes [provider]  ALPHA-LIPOIC ACID PO Take 2 capsules by mouth daily.   Yes [provider]  Aspirin-Salicylamide-Caffeine (BC HEADACHE POWDER PO) Take 0.5 Packages by mouth daily as needed (Pain).   Yes [provider]  Cholecalciferol (VITAMIN D3 PO) Take 2,000-3,000 Units by mouth daily. Vitamin D3 Liquid   Yes [provider]  Co-Enzyme Q-10 100 MG CAPS Take 100 mg by mouth daily.   Yes [provider]  COLLAGEN PO Take 3 capsules by mouth daily.     Yes [provider]  Digestive Enzyme CAPS Take 1 capsule by mouth daily.   Yes [provider]  dorzolamide-timolol (COSOPT) 22.3-6.8 MG/ML ophthalmic solution Place 1 drop into both eyes 2 (two) times daily. 09/19/19  Yes [provider]  FLUoxetine (PROZAC) 10 MG capsule Take 10 mg by mouth at bedtime.   Yes [provider]  fluticasone (FLONASE) 50 MCG/ACT nasal spray Place 2 sprays into both nostrils daily as needed for allergies. 06/23/19  Yes [provider]  latanoprost (XALATAN) 0.005 % ophthalmic solution Place 1 drop into both eyes at bedtime. 09/06/19  Yes [provider]  LORazepam (ATIVAN) 0.5 MG tablet Take 0.25 mg by mouth 2 (two) times daily as needed for anxiety or sleep. 08/19/19  Yes [provider]  montelukast (SINGULAIR) 10 MG tablet Take 10 mg by mouth at bedtime. 04/19/19  Yes [provider]  phenylephrine (NEO-SYNEPHRINE) 1 % nasal spray Place 1 drop into both nostrils every 6 (six) hours as needed for congestion.   Yes [provider]  SYMBICORT 160-4.5 MCG/ACT inhaler Inhale 2 puffs into the lungs 2 (two) times daily. 09/23/19  Yes [provider]   DG Hip Unilat W or Wo Pelvis 2-3 Views Left  Result Date: 09/23/2019 CLINICAL DATA:  Fall, hip pain EXAM: DG HIP (WITH OR WITHOUT PELVIS) 2-3V LEFT COMPARISON:  None. FINDINGS: Acute intertrochanteric fracture of the left femur with displacement and varus angulation. No dislocation. IMPRESSION: Acute intertrochanteric fracture of the left femur with displacement and angulation. Electronically Signed   By: Praneil  Patel M.D.   On: 09/23/2019 20:53    Positive ROS: All other systems have been reviewed and were otherwise negative with the exception of those mentioned in the HPI and as above.  Labs cbc Recent Labs    09/23/19 2141  WBC 11.0*  HGB 10.6*  HCT 34.6*  PLT 330    Labs inflam No results for input(s): CRP in the last 72 hours.  Invalid input(s): ESR  Labs coag Recent Labs    09/23/19 2141  INR 0.9    Recent Labs    09/23/19 2141  NA 143  K 3.5  CL 107  CO2 25  GLUCOSE 116*  BUN 10  CREATININE 0.69  CALCIUM 9.0    Physical Exam: Vitals:   09/24/19 0036 09/24/19 0356  BP: 112/69 116/60  Pulse: 82 67  Resp: 16 16  Temp: 98.7 F (37.1 C) 98.4 F (36.9 C)  SpO2: 97% 97%   General: Alert, no acute distress Cardiovascular: No pedal edema Respiratory: No cyanosis, no use of accessory musculature GI: No organomegaly, abdomen is soft and non-tender Skin: No lesions in the area of chief complaint other than those  listed below in MSK exam.  Neurologic: Sensation intact distally save for the below mentioned MSK exam Psychiatric: Patient is competent for consent with normal mood and affect Lymphatic: No axillary or cervical lymphadenopathy  MUSCULOSKELETAL:  LLE: NVI, skin benign Other extremities are atraumatic with painless ROM and NVI.  Assessment: Left intertroch fracture  Plan: I have discussed the risks with patient and family and they wish to proceed with IM nail today   Timothy D Murphy, MD    09/24/2019 7:49 AM    

## 2019-09-24 NOTE — Progress Notes (Signed)
PROGRESS NOTE    Diana Potts  JSE:[id number] DOB: 1940/04/24 DOA: 09/23/2019 PCP: Patient, No Pcp Per    Brief Narrative:  79 year old female with history of COPD, chronic and stable, osteoporosis and anxiety presented with mechanical fall and left femoral fracture.  Admitted to hospital with acute traumatic femoral fracture and underwent ORIF.   Assessment & Plan:   Principal Problem:   Hip fracture (HCC) Active Problems:   Macrocytic anemia   COPD (chronic obstructive pulmonary disease) (HCC)   Anxiety  Left hip fracture: Underwent ORIF, immediate postop now.  Adequate pain medications.  Start mobilizing with PT OT. Weightbearing as tolerated. SCDs for DVT prophylaxis.  Will defer to surgeon to start chemical prophylaxis. IV and oral opiates.  Patient prefers to use high-dose ibuprofen.  COPD: Chronic and stable.  On Singulair, steroid inhalers and albuterol as needed.  Anxiety: Chronic and stable.  Takes Prozac.  Occasionally uses Ativan as needed that she can use.   DVT prophylaxis: SCDs Start: 09/23/19 2330   Code Status: Full code Family Communication: None today Disposition Plan: Status is: Inpatient  Remains inpatient appropriate because:Inpatient level of care appropriate due to severity of illness   Dispo:  Patient From: Home  Planned Disposition: Skilled Nursing Facility  Expected discharge date: 09/26/19  Medically stable for discharge: No          Consultants:   Orthopedics, Dr. Eulah Pont  Procedures:   ORIF left hip 7/17  Antimicrobials:   Perioperative   Subjective: Patient seen and examined.  She came back from surgery.  Moderate pain on the left hip, better than before surgery.  Just took some oral oxycodone.  Denies any other complaints.  Dry mouth.  Objective: Vitals:   09/24/19 1105 09/24/19 1123 09/24/19 1130 09/24/19 1158  BP: (!) 114/55 (!) 115/58  104/60  Pulse: 66 69  (!) 59  Resp: 10 14  16   Temp:   (!) 97.1 F (36.2  C) 98.5 F (36.9 C)  TempSrc:    Oral  SpO2: 99% 98%  100%  Weight:      Height:        Intake/Output Summary (Last 24 hours) at 09/24/2019 1410 Last data filed at 09/24/2019 1036 Gross per 24 hour  Intake 500 ml  Output 50 ml  Net 450 ml   Filed Weights   09/23/19 2007  Weight: 52.2 kg    Examination:  Physical Exam Constitutional:      Appearance: Normal appearance.     Comments: Alert.  Pleasant.  HENT:     Head: Normocephalic.  Eyes:     Pupils: Pupils are equal, round, and reactive to light.  Cardiovascular:     Rate and Rhythm: Normal rate.     Heart sounds: Normal heart sounds.  Pulmonary:     Breath sounds: Normal breath sounds.  Musculoskeletal:     Comments: Left hip immediate postop.  Not removed by me.  Distal neurovascular status intact.  Neurological:     Mental Status: She is alert.       Data Reviewed: I have personally reviewed following labs and imaging studies  CBC: Recent Labs  Lab 09/23/19 2141  WBC 11.0*  NEUTROABS 8.9*  HGB 10.6*  HCT 34.6*  MCV 100.3*  PLT 330   Basic Metabolic Panel: Recent Labs  Lab 09/23/19 2141  NA 143  K 3.5  CL 107  CO2 25  GLUCOSE 116*  BUN 10  CREATININE 0.69  CALCIUM 9.0   GFR:  Estimated Creatinine Clearance: 47.8 mL/min (by C-G formula based on SCr of 0.69 mg/dL). Liver Function Tests: No results for input(s): AST, ALT, ALKPHOS, BILITOT, PROT, ALBUMIN in the last 168 hours. No results for input(s): LIPASE, AMYLASE in the last 168 hours. No results for input(s): AMMONIA in the last 168 hours. Coagulation Profile: Recent Labs  Lab 09/23/19 2141  INR 0.9   Cardiac Enzymes: No results for input(s): CKTOTAL, CKMB, CKMBINDEX, TROPONINI in the last 168 hours. BNP (last 3 results) No results for input(s): PROBNP in the last 8760 hours. HbA1C: No results for input(s): HGBA1C in the last 72 hours. CBG: No results for input(s): GLUCAP in the last 168 hours. Lipid Profile: No results for  input(s): CHOL, HDL, LDLCALC, TRIG, CHOLHDL, LDLDIRECT in the last 72 hours. Thyroid Function Tests: No results for input(s): TSH, T4TOTAL, FREET4, T3FREE, THYROIDAB in the last 72 hours. Anemia Panel: Recent Labs    09/24/19 1209  VITAMINB12 6,371*  FOLATE 13.2  TIBC 284  IRON 72   Sepsis Labs: No results for input(s): PROCALCITON, LATICACIDVEN in the last 168 hours.  Recent Results (from the past 240 hour(s))  SARS Coronavirus 2 by RT PCR (hospital order, performed in [facility] hospital lab) Nasopharyngeal Nasopharyngeal Swab     Status: None   Collection Time: 09/23/19  9:41 PM   Specimen: Nasopharyngeal Swab  Result Value Ref Range Status   SARS Coronavirus 2 NEGATIVE NEGATIVE Final    Comment: (NOTE) SARS-CoV-2 target nucleic acids are NOT DETECTED.  The SARS-CoV-2 RNA is generally detectable in upper and lower respiratory specimens during the acute phase of infection. The lowest concentration of SARS-CoV-2 viral copies this assay can detect is 250 copies / mL. A negative result does not preclude SARS-CoV-2 infection and should not be used as the sole basis for treatment or other patient management decisions.  A negative result may occur with improper specimen collection / handling, submission of specimen other than nasopharyngeal swab, presence of viral mutation(s) within the areas targeted by this assay, and inadequate number of viral copies (<250 copies / mL). A negative result must be combined with clinical observations, patient history, and epidemiological information.  Fact Sheet for Patients:   BoilerBrush.com.cy  Fact Sheet for Healthcare Providers: https://pope.com/  This test is not yet approved or  cleared by the [place] FDA and has been authorized for detection and/or diagnosis of SARS-CoV-2 by FDA under an Emergency Use Authorization (EUA).  This EUA will remain in effect (meaning this test can be  used) for the duration of the COVID-19 declaration under Section 564(b)(1) of the Act, 21 U.S.C. section 360bbb-3(b)(1), unless the authorization is terminated or revoked sooner.  Performed at [facility], [address] [place], [place] [address]   MRSA PCR Screening     Status: None   Collection Time: 09/24/19  1:51 AM   Specimen: Nasal Mucosa; Nasopharyngeal  Result Value Ref Range Status   MRSA by PCR NEGATIVE NEGATIVE Final    Comment:        The GeneXpert MRSA Assay (FDA approved for NASAL specimens only), is one component of a comprehensive MRSA colonization surveillance program. It is not intended to diagnose MRSA infection nor to guide or monitor treatment for MRSA infections. Performed at [facility], [address], [place], [place] [address]          Radiology Studies: DG C-Arm 1-60 Min  Result Date: 09/24/2019 CLINICAL DATA:  Left femoral intertrochanteric fracture. EXAM:  DG HIP (WITH OR WITHOUT PELVIS) 2-3V LEFT; DG C-ARM 1-60 MIN COMPARISON:  02/23/2020 FINDINGS: Intraoperative fluoroscopic images demonstrate the left femoral intertrochanteric fracture. Intramedullary nail was placed in the left femur and interlocking screw was placed through the femoral head and neck. Intramedullary nail extends down to the distal femur. IMPRESSION: Internal fixation of the left femoral intertrochanteric fracture. Electronically Signed   By: Richarda Overlie M.D.   On: 09/24/2019 13:00   DG HIP UNILAT WITH PELVIS 2-3 VIEWS LEFT  Result Date: 09/24/2019 CLINICAL DATA:  Left femoral intertrochanteric fracture. EXAM: DG HIP (WITH OR WITHOUT PELVIS) 2-3V LEFT; DG C-ARM 1-60 MIN COMPARISON:  02/23/2020 FINDINGS: Intraoperative fluoroscopic images demonstrate the left femoral intertrochanteric fracture. Intramedullary nail was placed in the left femur and interlocking screw was placed through the femoral head and neck. Intramedullary nail extends down to the distal femur.  IMPRESSION: Internal fixation of the left femoral intertrochanteric fracture. Electronically Signed   By: Richarda Overlie M.D.   On: 09/24/2019 13:00   DG Hip Unilat W or Wo Pelvis 2-3 Views Left  Result Date: 09/23/2019 CLINICAL DATA:  Fall, hip pain EXAM: DG HIP (WITH OR WITHOUT PELVIS) 2-3V LEFT COMPARISON:  None. FINDINGS: Acute intertrochanteric fracture of the left femur with displacement and varus angulation. No dislocation. IMPRESSION: Acute intertrochanteric fracture of the left femur with displacement and angulation. Electronically Signed   By: Guadlupe Spanish M.D.   On: 09/23/2019 20:53        Scheduled Meds: . cholecalciferol  2,000 Units Oral Daily  . FLUoxetine  10 mg Oral QHS  . HYDROcodone-acetaminophen  1 tablet Oral Q6H  . mometasone-formoterol  2 puff Inhalation BID  . montelukast  10 mg Oral QHS  . ondansetron (ZOFRAN) IV  4 mg Intravenous Once  . senna  1 tablet Oral BID   Continuous Infusions: . sodium chloride 75 mL/hr at 09/24/19 0100     LOS: 1 day    Time spent: 25 minutes    Dorcas Carrow, MD [facility] Pager [phone]

## 2019-09-24 NOTE — Anesthesia Postprocedure Evaluation (Signed)
Anesthesia Post Note  Patient: Diana Potts  Procedure(s) Performed: INTRAMEDULLARY (IM) NAIL, HIP (Left Hip)     Patient location during evaluation: PACU Anesthesia Type: Spinal Level of consciousness: awake and alert Pain management: pain level controlled Vital Signs Assessment: post-procedure vital signs reviewed and stable Respiratory status: spontaneous breathing Cardiovascular status: stable Anesthetic complications: no   No complications documented.  Last Vitals:  Vitals:   09/24/19 1507 09/24/19 1913  BP: 110/62 (!) 105/51  Pulse: 65 61  Resp: 17 16  Temp: 36.9 C 37.1 C  SpO2: 100% 97%    Last Pain:  Vitals:   09/24/19 1913  TempSrc: Oral  PainSc: 0-No pain                 Lewie Loron

## 2019-09-24 NOTE — Anesthesia Procedure Notes (Signed)
Spinal  Patient location during procedure: OR Staffing Anesthesiologist: Jaterrius Ricketson, MD Preanesthetic Checklist Completed: patient identified, IV checked, site marked, risks and benefits discussed, surgical consent, monitors and equipment checked, pre-op evaluation and timeout performed Spinal Block Patient position: sitting Prep: DuraPrep Patient monitoring: heart rate, cardiac monitor, continuous pulse ox and blood pressure Approach: right paramedian Location: L3-4 Injection technique: single-shot Needle Needle type: Sprotte  Needle gauge: 24 G Needle length: 9 cm Assessment Sensory level: T4     

## 2019-09-25 LAB — BASIC METABOLIC PANEL
Anion gap: 5 (ref 5–15)
BUN: 9 mg/dL (ref 8–23)
CO2: 26 mmol/L (ref 22–32)
Calcium: 8.2 mg/dL — ABNORMAL LOW (ref 8.9–10.3)
Chloride: 110 mmol/L (ref 98–111)
Creatinine, Ser: 0.65 mg/dL (ref 0.44–1.00)
GFR calc Af Amer: >60 mL/min (ref >60)
GFR calc non Af Amer: >60 mL/min (ref >60)
Glucose, Bld: 115 mg/dL — ABNORMAL HIGH (ref 70–99)
Potassium: 3.3 mmol/L — ABNORMAL LOW (ref 3.5–5.1)
Sodium: 141 mmol/L (ref 135–145)

## 2019-09-25 LAB — CBC WITH DIFFERENTIAL/PLATELET
Abs Immature Granulocytes: 0.03 10*3/uL (ref 0.00–0.07)
Basophils Absolute: 0.1 10*3/uL (ref 0.0–0.1)
Basophils Relative: 1 %
Eosinophils Absolute: 0.4 10*3/uL (ref 0.0–0.5)
Eosinophils Relative: 5 %
HCT: 28.3 % — ABNORMAL LOW (ref 36.0–46.0)
Hemoglobin: 8.9 g/dL — ABNORMAL LOW (ref 12.0–15.0)
Immature Granulocytes: 0 %
Lymphocytes Relative: 17 %
Lymphs Abs: 1.2 10*3/uL (ref 0.7–4.0)
MCH: 31.1 pg (ref 26.0–34.0)
MCHC: 31.4 g/dL (ref 30.0–36.0)
MCV: 99 fL (ref 80.0–100.0)
Monocytes Absolute: 0.8 10*3/uL (ref 0.1–1.0)
Monocytes Relative: 11 %
Neutro Abs: 4.7 10*3/uL (ref 1.7–7.7)
Neutrophils Relative %: 66 %
Platelets: 228 10*3/uL (ref 150–400)
RBC: 2.86 MIL/uL — ABNORMAL LOW (ref 3.87–5.11)
RDW: 13.7 % (ref 11.5–15.5)
WBC: 7.2 10*3/uL (ref 4.0–10.5)
nRBC: 0 % (ref 0.0–0.2)

## 2019-09-25 LAB — PHOSPHORUS: Phosphorus: 3.6 mg/dL (ref 2.5–4.6)

## 2019-09-25 LAB — MAGNESIUM: Magnesium: 2.1 mg/dL (ref 1.7–2.4)

## 2019-09-25 MED ORDER — SODIUM CHLORIDE 0.9 % IV BOLUS
500.0000 mL | Freq: Once | INTRAVENOUS | Status: AC
  Administered 2019-09-25: 500 mL via INTRAVENOUS

## 2019-09-25 MED ORDER — ASPIRIN 325 MG PO TABS
325.0000 mg | ORAL_TABLET | Freq: Two times a day (BID) | ORAL | Status: DC
  Administered 2019-09-25 – 2019-10-01 (×12): 325 mg via ORAL
  Filled 2019-09-25 (×13): qty 1

## 2019-09-25 MED ORDER — PANTOPRAZOLE SODIUM 40 MG PO TBEC
40.0000 mg | DELAYED_RELEASE_TABLET | Freq: Every day | ORAL | Status: DC
  Administered 2019-09-25 – 2019-09-30 (×6): 40 mg via ORAL
  Filled 2019-09-25 (×6): qty 1

## 2019-09-25 NOTE — Progress Notes (Signed)
Subjective: 1 Day Post-Op s/p Procedure(s): INTRAMEDULLARY (IM) NAIL, HIP  Patient reports pain as mild. No other complaints.    Objective:  PE: VITALS:   Vitals:   09/24/19 1507 09/24/19 1913 09/25/19 0331 09/25/19 0833  BP: 110/62 (!) 105/51 (!) 109/46 (!) 104/48  Pulse: 65 61 60 70  Resp: 17 16 14 15   Temp: 98.5 F (36.9 C) 98.7 F (37.1 C) 97.8 F (36.6 C) 97.6 F (36.4 C)  TempSrc: Oral Oral Oral Oral  SpO2: 100% 97% 97% 96%  Weight:      Height:       Gen: Patient alert, oriented, in no acute distress MSK: Neurovascular intact Sensation intact distally Intact pulses distally Dorsiflexion/Plantar flexion intact Incision: dressing C/D/I  LABS  Results for orders placed or performed during the hospital encounter of 09/23/19 (from the past 24 hour(s))  Vitamin B12     Status: Abnormal   Collection Time: 09/24/19 12:09 PM  Result Value Ref Range   Vitamin B-12 6,371 (H) 180 - 914 pg/mL  Folate, serum, performed at [facility] lab     Status: None   Collection Time: 09/24/19 12:09 PM  Result Value Ref Range   Folate 13.2 >5.9 ng/mL  Iron and TIBC     Status: None   Collection Time: 09/24/19 12:09 PM  Result Value Ref Range   Iron 72 28 - 170 ug/dL   TIBC 09/26/19 673 - 419 ug/dL   Saturation Ratios 25 10.4 - 31.8 %   UIBC 212 ug/dL  CBC with Differential/Platelet     Status: Abnormal   Collection Time: 09/25/19  2:38 AM  Result Value Ref Range   WBC 7.2 4.0 - 10.5 K/uL   RBC 2.86 (L) 3.87 - 5.11 MIL/uL   Hemoglobin 8.9 (L) 12.0 - 15.0 g/dL   HCT 09/27/19 (L) 36 - 46 %   MCV 99.0 80.0 - 100.0 fL   MCH 31.1 26.0 - 34.0 pg   MCHC 31.4 30.0 - 36.0 g/dL   RDW 02.4 09.7 - 35.3 %   Platelets 228 150 - 400 K/uL   nRBC 0.0 0.0 - 0.2 %   Neutrophils Relative % 66 %   Neutro Abs 4.7 1.7 - 7.7 K/uL   Lymphocytes Relative 17 %   Lymphs Abs 1.2 0.7 - 4.0 K/uL   Monocytes Relative 11 %   Monocytes Absolute 0.8 0 - 1 K/uL   Eosinophils Relative 5 %   Eosinophils  Absolute 0.4 0 - 0 K/uL   Basophils Relative 1 %   Basophils Absolute 0.1 0 - 0 K/uL   Immature Granulocytes 0 %   Abs Immature Granulocytes 0.03 0.00 - 0.07 K/uL  Basic metabolic panel     Status: Abnormal   Collection Time: 09/25/19  2:38 AM  Result Value Ref Range   Sodium 141 135 - 145 mmol/L   Potassium 3.3 (L) 3.5 - 5.1 mmol/L   Chloride 110 98 - 111 mmol/L   CO2 26 22 - 32 mmol/L   Glucose, Bld 115 (H) 70 - 99 mg/dL   BUN 9 8 - 23 mg/dL   Creatinine, Ser 09/27/19 0.44 - 1.00 mg/dL   Calcium 8.2 (L) 8.9 - 10.3 mg/dL   GFR calc non Af Amer >60 >60 mL/min   GFR calc Af Amer >60 >60 mL/min   Anion gap 5 5 - 15  Magnesium     Status: None   Collection Time: 09/25/19  2:38 AM  Result Value Ref Range   Magnesium 2.1 1.7 - 2.4 mg/dL  Phosphorus     Status: None   Collection Time: 09/25/19  2:38 AM  Result Value Ref Range   Phosphorus 3.6 2.5 - 4.6 mg/dL    DG C-Arm 6-07 Min  Result Date: 09/24/2019 CLINICAL DATA:  Left femoral intertrochanteric fracture. EXAM: DG HIP (WITH OR WITHOUT PELVIS) 2-3V LEFT; DG C-ARM 1-60 MIN COMPARISON:  02/23/2020 FINDINGS: Intraoperative fluoroscopic images demonstrate the left femoral intertrochanteric fracture. Intramedullary nail was placed in the left femur and interlocking screw was placed through the femoral head and neck. Intramedullary nail extends down to the distal femur. IMPRESSION: Internal fixation of the left femoral intertrochanteric fracture. Electronically Signed   By: Richarda Overlie M.D.   On: 09/24/2019 13:00   DG HIP UNILAT WITH PELVIS 2-3 VIEWS LEFT  Result Date: 09/24/2019 CLINICAL DATA:  Left femoral intertrochanteric fracture. EXAM: DG HIP (WITH OR WITHOUT PELVIS) 2-3V LEFT; DG C-ARM 1-60 MIN COMPARISON:  02/23/2020 FINDINGS: Intraoperative fluoroscopic images demonstrate the left femoral intertrochanteric fracture. Intramedullary nail was placed in the left femur and interlocking screw was placed through the femoral head and neck.  Intramedullary nail extends down to the distal femur. IMPRESSION: Internal fixation of the left femoral intertrochanteric fracture. Electronically Signed   By: Richarda Overlie M.D.   On: 09/24/2019 13:00   DG Hip Unilat W or Wo Pelvis 2-3 Views Left  Result Date: 09/23/2019 CLINICAL DATA:  Fall, hip pain EXAM: DG HIP (WITH OR WITHOUT PELVIS) 2-3V LEFT COMPARISON:  None. FINDINGS: Acute intertrochanteric fracture of the left femur with displacement and varus angulation. No dislocation. IMPRESSION: Acute intertrochanteric fracture of the left femur with displacement and angulation. Electronically Signed   By: Guadlupe Spanish M.D.   On: 09/23/2019 20:53    Assessment/Plan: Principal Problem:   Hip fracture (HCC) Active Problems:   Macrocytic anemia   COPD (chronic obstructive pulmonary disease) (HCC)   Anxiety  Left hip fracture: 1 Day Post-Op s/p Procedure(s): INTRAMEDULLARY (IM) NAIL, HIP  Weightbearing: WBAT LLE, up with therapy Insicional and dressing care: reinforce as needed VTE prophylaxis: SCD's, aspirin Pain control: continue current regimen Follow - up plan: Follow-up with Dr. Eulah Pont in 2 weeks Dispo: pending PT eval, patient lives at home with son and is main caretaker for him as he has history of stroke  Contact information:   Weekdays [address], [place] [phone] A fter hours and holidays please check [email or web address] for group call information for [facility]  Armida Sans 09/25/2019, 10:42 AM

## 2019-09-25 NOTE — Evaluation (Signed)
Physical Therapy Evaluation Patient Details Name: Diana Potts MRN: [id number] DOB: 10/05/1940 Today's Date: 09/25/2019   History of Present Illness  Patient is a 79 year old female medical history significant for hx of anal cancer, COPD, osteoporosis, anxiety who presents following a mechanical fall sustaining L hip fracture. Patient s/p L IM nail.   Clinical Impression  Prior to admission, pt lives with her son and is independent; enjoys doing yoga. 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 She states she will have other family/friends able to assist upon discharge. Pt presents with decreased functional mobility secondary to LLE weakness, balance deficits and gait abnormalities. Pt requiring min assist for transfers to the chair. Further ambulation deferred due to dizziness; BP 114/51. Suspect pt will progress well.     Follow Up Recommendations Home health PT;Supervision for mobility/OOB    Equipment Recommendations  Rolling walker with 5" wheels;3in1 (PT)    Recommendations for Other Services       Precautions / Restrictions Precautions Precautions: Fall Restrictions Weight Bearing Restrictions: Yes LLE Weight Bearing: Weight bearing as tolerated      Mobility  Bed Mobility Overal bed mobility: Needs Assistance Bed Mobility: Supine to Sit     Supine to sit: Min assist Sit to supine: Mod assist   General bed mobility comments: MinA to progress LLE over to edge of bed  Transfers Overall transfer level: Needs assistance Equipment used: Rolling walker (2 wheeled) Transfers: Sit to/from [employer] Sit to Stand: Min assist Stand pivot transfers: Min guard       General transfer comment: MinA to rise from edge of bed and min guard to pivot towards right onto recliner. Good progression of LLE  Ambulation/Gait                Stairs            Wheelchair Mobility    Modified Rankin (Stroke Patients Only)       Balance  Overall balance assessment: Needs assistance Sitting-balance support: No upper extremity supported;Feet supported Sitting balance-Leahy Scale: Good     Standing balance support: Bilateral upper extremity supported;During functional activity Standing balance-Leahy Scale: Poor Standing balance comment: use of walker                             Pertinent Vitals/Pain Pain Assessment: Faces Faces Pain Scale: Hurts a little bit Pain Location: L hip Pain Descriptors / Indicators: Sore Pain Intervention(s): Monitored during session    Home Living Family/patient expects to be discharged to:: Private residence Living Arrangements: Children   Type of Home: Apartment Home Access: Stairs to enter   Secretary/administrator of Steps: 3 Home Layout: One level Home Equipment: Shower seat - built in;Grab bars - tub/shower Additional Comments: patient reports grab bar is in "odd place" and needs another one, son uses a cane d/t history CVA. 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      Hand Dominance   Dominant Hand: Right    Extremity/Trunk Assessment   Upper Extremity Assessment Upper Extremity Assessment: Overall WFL for tasks assessed    Lower Extremity Assessment Lower Extremity Assessment: LLE deficits/detail LLE Deficits / Details: Able to perform LAQ,  ankle dorsiflexion/plantarflexion 4/5    Cervical / Trunk Assessment Cervical / Trunk Assessment: Normal  Communication   Communication: No difficulties  Cognition Arousal/Alertness: Awake/alert Behavior During Therapy: WFL for tasks assessed/performed Overall Cognitive Status: Within Functional Limits for tasks assessed                                        General Comments      Exercises General Exercises -  Lower Extremity Ankle Circles/Pumps: Both;20 reps;Seated Long Arc Quad: Both;10 reps;Seated   Assessment/Plan    PT Assessment Patient needs continued PT services  PT Problem List Decreased strength;Decreased range of motion;Decreased activity tolerance;Decreased balance;Decreased mobility;Pain       PT Treatment Interventions DME instruction;Gait training;Stair training;Functional mobility training;Therapeutic activities;Therapeutic exercise;Balance training;Patient/family education    PT Goals (Current goals can be found in the Care Plan section)  Acute Rehab PT Goals Patient Stated Goal: return to doing yoga PT Goal Formulation: With patient Time For Goal Achievement: 10/09/19 Potential to Achieve Goals: Good    Frequency Min 5X/week   Barriers to discharge        Co-evaluation               AM-PAC PT "6 Clicks" Mobility  Outcome Measure Help needed turning from your back to your side while in a flat bed without using bedrails?: None Help needed moving from lying on your back to sitting on the side of a flat bed without using bedrails?: A Little Help needed moving to and from a bed to a chair (including a wheelchair)?: A Little Help needed standing up from a chair using your arms (e.g., wheelchair or bedside chair)?: A Little Help needed to walk in hospital room?: A Little Help needed climbing 3-5 steps with a railing? : A Lot 6 Click Score: 18    End of Session Equipment Utilized During Treatment: Gait belt Activity Tolerance: Patient tolerated treatment well Patient left: in chair;with call bell/phone within reach;with chair alarm set Nurse Communication: Mobility status PT Visit Diagnosis: Pain;Difficulty in walking, not elsewhere classified (R26.2) Pain - Right/Left: Left Pain - part of body: Leg    Time: 1045-1100 PT Time Calculation (min) (ACUTE ONLY): 15 min   Charges:   PT Evaluation $PT Eval Moderate Complexity: 1 Mod            Lillia Pauls, PT, DPT Acute Rehabilitation Services Pager [phone] Office [phone]   Norval Morton 09/25/2019, 11:45 AM

## 2019-09-25 NOTE — Social Work (Signed)
CSW acknowledging consult for HH/DME/SNF placement. Will follow for therapy recommendations needed to best determine disposition/for insurance authorization.   Denita Lun, MSW, LCSW [facility]    

## 2019-09-25 NOTE — Evaluation (Signed)
Occupational Therapy Evaluation Patient Details Name: Diana Potts MRN: [id number] DOB: 1940/08/19 Today's Date: 09/25/2019    History of Present Illness Patient is a 79 year old female medical history significant for hx of anal cancer, COPD, osteoporosis, anxiety who presents following a mechanical fall sustaining L hip fracture. Patient s/p L IM nail.    Clinical Impression   Patient with functional deficits listed below impacting safety and independence with self care. Patient min A donning socks long sitting in bed, assist to slide over L heel. Min G for bed mobility to edge of bed with use of bed rail. Min A transfer to recliner and cues for safety and body mechanics. Sitting in chair pt reports feeling dizzy, feet elevated and vitals cart obtained with BP reading 61/37. With CNA present patient assisted back to bed with min A. Once seated edge of bed patient have brief loss of consciousness therefore mod A to lift LEs back into bed. Once supine within few seconds patient responsive answering to her name. CNA notified RN immediately once patient back to bed safely. Patient left in supine with nursing staff in room at end of session. Due to patient's limited activity tolerance and increased level of assistance currently recommending rehab before returning home as patient's son cannot provide assistance d/t hx CVA. Pending patient progress maybe be [facility] for [facility], will continue to follow.    Follow Up Recommendations  SNF;Supervision/Assistance - 24 hour;Home health OT (vs HH pending patient progress )    Equipment Recommendations  3 in 1 bedside commode;Other (comment) (rolling walker)       Precautions / Restrictions Precautions Precautions: Fall Restrictions Weight Bearing Restrictions: Yes LLE Weight Bearing: Weight bearing as tolerated      Mobility Bed Mobility Overal bed mobility: Needs Assistance Bed Mobility: Supine to Sit;Sit to Supine     Supine to sit: Min guard;HOB  elevated Sit to supine: Mod assist   General bed mobility comments: min guard for safety with sitting up, mod A to lift LEs into bed due to orthostatic hypotension  Transfers Overall transfer level: Needs assistance   Transfers: Sit to/from Stand;Stand Pivot Transfers Sit to Stand: Min assist Stand pivot transfers: Min assist       General transfer comment: patient with decreased safety attempting to sit too quickly into recliner and cues for hand placement with standing not to pull on walker. In recliner patient reports feeling dizzy, feet elevated (unable to recline chair latch broken) BP taken 61/37. Patient min A back to edge of bed with cues for safety, upon sitting at edge of bed patient briefly lost consciousness therefore mod A for LEs back into the bed. once supine patient regains consiousness responding to her name. CNA in room during transfer back to bed and called RN right away once patient back in bed    Balance Overall balance assessment: Needs assistance Sitting-balance support: No upper extremity supported;Feet supported Sitting balance-Leahy Scale: Good     Standing balance support: Bilateral upper extremity supported;During functional activity Standing balance-Leahy Scale: Poor Standing balance comment: use of walker                           ADL either performed or assessed with clinical judgement   ADL Overall ADL's : Needs assistance/impaired Eating/Feeding: Set up;Bed level   Grooming: Set up;Sitting;Bed level   Upper Body Bathing: Set up;Sitting;Bed level   Lower Body Bathing: Minimal assistance;Sit to/from stand   Upper  Body Dressing : Set up;Sitting;Bed level   Lower Body Dressing: Minimal assistance;Sit to/from stand Lower Body Dressing Details (indicate cue type and reason): patient able to don B socks in long sitting with min A to slide over L heel Toilet Transfer: Minimal assistance;Cueing for safety;Cueing for  sequencing;BSC;RW;Stand-pivot Toilet Transfer Details (indicate cue type and reason): patient requires cues for safety attempts to sit too soon and quickly onto recliner Toileting- Clothing Manipulation and Hygiene: Minimal assistance;Sit to/from stand;Sitting/lateral lean       Functional mobility during ADLs: Minimal assistance;Rolling walker;Cueing for safety;Cueing for sequencing General ADL Comments: patient requiring increased assistance for self care due to pain, decreased activity tolerance, balance, safety awareness and orthostatic hypotension, see below in transfer section for details                  Pertinent Vitals/Pain Pain Assessment: Faces Faces Pain Scale: Hurts a little bit Pain Location: L hip Pain Descriptors / Indicators: Sore Pain Intervention(s): Premedicated before session     Hand Dominance Right   Extremity/Trunk Assessment Upper Extremity Assessment Upper Extremity Assessment: Overall WFL for tasks assessed   Lower Extremity Assessment Lower Extremity Assessment: Defer to PT evaluation   Cervical / Trunk Assessment Cervical / Trunk Assessment: Normal   Communication Communication Communication: No difficulties   Cognition Arousal/Alertness: Awake/alert Behavior During Therapy: WFL for tasks assessed/performed Overall Cognitive Status: Within Functional Limits for tasks assessed                                                Home Living Family/patient expects to be discharged to:: Private residence Living Arrangements: Children   Type of Home: Apartment Home Access: Stairs to enter Secretary/administrator of Steps: 3   Home Layout: One level     Bathroom Shower/Tub: Producer, television/film/video: Standard     Home Equipment: Shower seat - built in;Grab bars - tub/shower   Additional Comments: patient reports grab bar is in "odd place" and needs another one, son uses a cane d/t history CVA. 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         OT Problem List: Decreased strength;Decreased activity tolerance;Impaired balance (sitting and/or standing);Decreased safety awareness;Decreased knowledge of use of DME or AE;Pain      OT Treatment/Interventions: Self-care/ADL training;Therapeutic exercise;Energy conservation;DME and/or AE instruction;Therapeutic activities;Patient/family education;Balance training    OT Goals(Current goals can be found in the care plan section) Acute Rehab OT Goals Patient Stated Goal: "I need to use the toilet" OT Goal Formulation: With patient Time For Goal Achievement: 10/09/19 Potential to Achieve Goals: Good  OT Frequency: Min 2X/week   Barriers to D/C: Decreased caregiver support  patient is caregiver for her son          AM-PAC OT "6 Clicks" Daily Activity     Outcome Measure Help from another person eating meals?: None Help from another person taking care of personal grooming?: A Little Help from another person toileting, which includes using toliet, bedpan, or urinal?: A Little Help from another person bathing (including washing, rinsing, drying)?: A Little Help from another person  to put on and taking off regular upper body clothing?: A Little Help from another person to put on and taking off regular lower body clothing?: A Little 6 Click Score: 19   End of Session Equipment Utilized During Treatment: Rolling walker Nurse Communication: Mobility status;Other (comment) (orthostatic)  Activity Tolerance: Treatment limited secondary to medical complications (Comment) (orthostatic hypotension) Patient left: in bed;with call bell/phone within reach;with nursing/sitter in room  OT Visit Diagnosis: Unsteadiness on feet (R26.81);Other abnormalities of gait and  mobility (R26.89);History of falling (Z91.81);Pain Pain - Right/Left: Left Pain - part of body: Hip                Time: 0923-3007 OT Time Calculation (min): 28 min Charges:  OT General Charges $OT Visit: 1 Visit OT Evaluation $OT Eval Moderate Complexity: 1 Mod OT Treatments $Self Care/Home Management : 8-22 mins  Marlyce Huge OT OT office: [phone]   Carmelia Roller 09/25/2019, 11:37 AM

## 2019-09-25 NOTE — Progress Notes (Signed)
PROGRESS NOTE  Diana Potts  DOB: [birth year]  PCP: Patient, No Pcp Per [facility]:[id number]  DOA: 09/23/2019  LOS: 2 days   Chief Complaint  Patient presents with  . Hip Pain    left    Brief narrative: 79 year old female with history of COPD, chronic and stable, osteoporosis and anxiety.  Patient presented with mechanical fall and left femoral fracture.  She was admitted to hospital with acute traumatic femoral fracture Underwent ORIF on 7/17.  Subjective: Patient was seen and examined this morning.  Pleasant elderly Caucasian female.  Sitting up in chair.  Not in distress.  Earlier while getting up with PT, she got dizzy and her blood pressure dropped.  Assessment/Plan: Left hip fracture: -7/17, underwent ORIF. -Pain control  -Aspirin 325 mg twice daily for DVT prophylaxis per orthopedics.  I will start the patient on Protonix while on aspirin twice daily. -PT OT evaluation.  Orthostatic hypotension -Earlier today, while getting up with PT, she got dizzy and her blood pressure dropped. -Likely orthostatic blood pressure drop. -500 mL of normal saline bolus was given.  Patient is also continued on normal saline at 75 mL/h.  COPD:  -Chronic and stable.  On Singulair, steroid inhalers and albuterol as needed. -Not on supplemental oxygen.  Anxiety:  -Chronic and stable.   -Takes Prozac.   -Occasionally uses Ativan as needed that she can use.  Mobility: PT OT evaluation Code Status:   Code Status: Full Code  Nutritional status: Body mass index is 18.56 kg/m.     Diet Order            Diet regular Room service appropriate? Yes; Fluid consistency: Thin  Diet effective now                 DVT prophylaxis: Aspirin 325 mg twice daily for DVT prophylaxis per orthopedics.   SCDs Start: 09/23/19 2330   Antimicrobials:  None Fluid: None  Consultants: Orthopedics Family Communication:  None at bedside  Status is: Inpatient  Remains inpatient appropriate  because:Unsafe d/c plan   Dispo:  Patient From: Home  Planned Disposition: Skilled Nursing Facility  Expected discharge date: 09/26/19  Medically stable for discharge: No        Infusions:  . sodium chloride 75 mL/hr at 09/25/19 1043    Scheduled Meds: . aspirin  325 mg Oral BID  . cholecalciferol  2,000 Units Oral Daily  . FLUoxetine  10 mg Oral QHS  . HYDROcodone-acetaminophen  1 tablet Oral Q6H  . mometasone-formoterol  2 puff Inhalation BID  . montelukast  10 mg Oral QHS  . ondansetron (ZOFRAN) IV  4 mg Intravenous Once  . senna  1 tablet Oral BID    Antimicrobials: Anti-infectives (From admission, onward)   Start     Dose/Rate Route Frequency Ordered Stop   09/24/19 0800  ceFAZolin (ANCEF) IVPB 2g/100 mL premix        2 g 200 mL/hr over 30 Minutes Intravenous  Once 09/24/19 0752 09/24/19 1008   09/24/19 0753  ceFAZolin (ANCEF) 2-4 GM/100ML-% IVPB       Note to Pharmacy: Shireen Quan   : cabinet override      09/24/19 0753 09/24/19 0938      PRN meds: albuterol, ibuprofen, LORazepam, menthol-cetylpyridinium, morphine injection   Objective: Vitals:   09/25/19 0331 09/25/19 0833  BP: (!) 109/46 (!) 104/48  Pulse: 60 70  Resp: 14 15  Temp: 97.8 F (36.6 C) 97.6 F (36.4 C)  SpO2: 97% 96%  Intake/Output Summary (Last 24 hours) at 09/25/2019 1442 Last data filed at 09/25/2019 0900 Gross per 24 hour  Intake 1480 ml  Output 1500 ml  Net -20 ml   Filed Weights   09/23/19 2007  Weight: 52.2 kg   Weight change:  Body mass index is 18.56 kg/m.   Physical Exam: General exam: Appears calm and comfortable.  Skin: No rashes, lesions or ulcers. HEENT: Atraumatic, normocephalic, supple neck, no obvious bleeding Lungs: Clear to auscultation bilaterally CVS: Regular rate and rhythm, no murmur GI/Abd soft, nontender, nondistended, bowel sound present CNS: Alert, awake, oriented x3 Psychiatry: Mood appropriate Extremities: No pedal edema, no calf  tenderness  Data Review: I have personally reviewed the laboratory data and studies available.  Recent Labs  Lab 09/23/19 2141 09/25/19 0238  WBC 11.0* 7.2  NEUTROABS 8.9* 4.7  HGB 10.6* 8.9*  HCT 34.6* 28.3*  MCV 100.3* 99.0  PLT 330 228   Recent Labs  Lab 09/23/19 2141 09/25/19 0238  NA 143 141  K 3.5 3.3*  CL 107 110  CO2 25 26  GLUCOSE 116* 115*  BUN 10 9  CREATININE 0.69 0.65  CALCIUM 9.0 8.2*  MG  --  2.1  PHOS  --  3.6    Signed, Lorin Glass, MD [facility] Pager: [phone] (Secure Chat preferred). 09/25/2019

## 2019-09-26 ENCOUNTER — Encounter (HOSPITAL_COMMUNITY): Payer: Self-pay | Admitting: Family Medicine

## 2019-09-26 MED ORDER — ONDANSETRON HCL 4 MG PO TABS
4.0000 mg | ORAL_TABLET | Freq: Four times a day (QID) | ORAL | Status: DC | PRN
  Administered 2019-09-27: 4 mg via ORAL
  Filled 2019-09-26: qty 1

## 2019-09-26 MED ORDER — BISACODYL 10 MG RE SUPP
10.0000 mg | Freq: Every day | RECTAL | Status: DC | PRN
  Filled 2019-09-26: qty 1

## 2019-09-26 MED ORDER — DOCUSATE SODIUM 100 MG PO CAPS
100.0000 mg | ORAL_CAPSULE | Freq: Two times a day (BID) | ORAL | Status: DC
  Administered 2019-09-26 – 2019-09-27 (×3): 100 mg via ORAL
  Filled 2019-09-26 (×4): qty 1

## 2019-09-26 MED ORDER — POTASSIUM CHLORIDE CRYS ER 20 MEQ PO TBCR
40.0000 meq | EXTENDED_RELEASE_TABLET | Freq: Once | ORAL | Status: AC
  Administered 2019-09-26: 40 meq via ORAL
  Filled 2019-09-26: qty 2

## 2019-09-26 MED ORDER — DORZOLAMIDE HCL-TIMOLOL MAL 2-0.5 % OP SOLN
1.0000 [drp] | Freq: Two times a day (BID) | OPHTHALMIC | Status: DC
  Administered 2019-09-26 – 2019-10-01 (×10): 1 [drp] via OPHTHALMIC
  Filled 2019-09-26: qty 10

## 2019-09-26 MED ORDER — ACETAMINOPHEN 500 MG PO TABS
500.0000 mg | ORAL_TABLET | Freq: Four times a day (QID) | ORAL | Status: AC
  Administered 2019-09-26 (×3): 500 mg via ORAL
  Filled 2019-09-26 (×3): qty 1

## 2019-09-26 MED ORDER — CEFAZOLIN SODIUM-DEXTROSE 2-4 GM/100ML-% IV SOLN
2.0000 g | Freq: Four times a day (QID) | INTRAVENOUS | Status: AC
  Administered 2019-09-26 (×2): 2 g via INTRAVENOUS
  Filled 2019-09-26 (×2): qty 100

## 2019-09-26 MED ORDER — ONDANSETRON HCL 4 MG/2ML IJ SOLN
4.0000 mg | Freq: Four times a day (QID) | INTRAMUSCULAR | Status: DC | PRN
  Administered 2019-09-26 – 2019-09-27 (×2): 4 mg via INTRAVENOUS
  Filled 2019-09-26 (×2): qty 2

## 2019-09-26 MED ORDER — METOCLOPRAMIDE HCL 5 MG PO TABS: 5.0000 mg | ORAL_TABLET | Freq: Three times a day (TID) | ORAL | Status: DC | PRN

## 2019-09-26 MED ORDER — HYDROCODONE-ACETAMINOPHEN 5-325 MG PO TABS
1.0000 | ORAL_TABLET | ORAL | Status: DC | PRN
  Administered 2019-09-26 – 2019-09-29 (×3): 2 via ORAL
  Filled 2019-09-26 (×2): qty 2
  Filled 2019-09-26: qty 1
  Filled 2019-09-26: qty 2

## 2019-09-26 MED ORDER — METOCLOPRAMIDE HCL 5 MG/ML IJ SOLN: 5.0000 mg | Freq: Three times a day (TID) | INTRAMUSCULAR | Status: DC | PRN

## 2019-09-26 MED ORDER — FLEET ENEMA 7-19 GM/118ML RE ENEM
1.0000 | ENEMA | Freq: Once | RECTAL | Status: DC | PRN
  Filled 2019-09-26: qty 1

## 2019-09-26 MED ORDER — LATANOPROST 0.005 % OP SOLN
1.0000 [drp] | Freq: Every day | OPHTHALMIC | Status: DC
  Administered 2019-09-26 – 2019-09-30 (×4): 1 [drp] via OPHTHALMIC
  Filled 2019-09-26: qty 2.5

## 2019-09-26 MED ORDER — POLYETHYLENE GLYCOL 3350 17 G PO PACK: 17.0000 g | PACK | Freq: Every day | ORAL | Status: DC | PRN

## 2019-09-26 NOTE — Progress Notes (Signed)
Physical Therapy Treatment Patient Details Name: Diana Potts MRN: [id number] DOB: 11-07-1940 Today's Date: 09/26/2019    History of Present Illness Patient is a 79 year old female medical history significant for hx of anal cancer, COPD, osteoporosis, anxiety who presents following a mechanical fall sustaining L hip fracture. Patient s/p L IM nail.     PT Comments    Pt supine in bed on arrival.  She is eager to mobilize.  Pt performed LLE exercises and short bout of gt training.  Pt very concerned that she hasn't been able to have a BM.  Will continue to follow to progress mobility.     Follow Up Recommendations  Home health PT;Supervision for mobility/OOB     Equipment Recommendations  Rolling walker with 5" wheels;3in1 (PT)    Recommendations for Other Services       Precautions / Restrictions Precautions Precautions: Fall Restrictions Weight Bearing Restrictions: No LLE Weight Bearing: Weight bearing as tolerated    Mobility  Bed Mobility Overal bed mobility: Needs Assistance Bed Mobility: Supine to Sit     Supine to sit: Min assist     General bed mobility comments: MIn assistance for progression of LLE, heavy use of bed rail with increased time.  Transfers Overall transfer level: Needs assistance Equipment used: Rolling walker (2 wheeled) Transfers: Sit to/from Stand Sit to Stand: Min assist         General transfer comment: Cues for hand placement with min assistance to boost into standing.  Ambulation/Gait Ambulation/Gait assistance: Min assist Gait Distance (Feet): 20 Feet Assistive device: Rolling walker (2 wheeled) Gait Pattern/deviations: Step-to pattern;Trunk flexed;Antalgic;Decreased stride length;Decreased stance time - left     General Gait Details: Cues for sequencing and upper trunk control.   Stairs             Wheelchair Mobility    Modified Rankin (Stroke Patients Only)       Balance Overall balance assessment: Needs  assistance Sitting-balance support: No upper extremity supported;Feet supported Sitting balance-Leahy Scale: Good     Standing balance support: Bilateral upper extremity supported;During functional activity Standing balance-Leahy Scale: Poor Standing balance comment: use of walker                            Cognition Arousal/Alertness: Awake/alert Behavior During Therapy: WFL for tasks assessed/performed Overall Cognitive Status: Within Functional Limits for tasks assessed                                        Exercises General Exercises - Lower Extremity Ankle Circles/Pumps: AROM;Both;10 reps;Supine Quad Sets: AROM;Left;10 reps;Supine Short Arc Quad: AROM;Left;10 reps;Supine Heel Slides: AROM;Left;10 reps;Supine Hip ABduction/ADduction: AROM;Left;10 reps;Supine    General Comments        Pertinent Vitals/Pain Pain Assessment: Faces Faces Pain Scale: Hurts little more Pain Location: L hip Pain Descriptors / Indicators: Sore Pain Intervention(s): Monitored during session;Repositioned    Home Living                      Prior Function            PT Goals (current goals can now be found in the care plan section) Acute Rehab PT Goals Patient Stated Goal: return to doing yoga Potential to Achieve Goals: Good Progress towards PT goals: Progressing toward goals    Frequency  Min 5X/week      PT Plan Current plan remains appropriate    Co-evaluation              AM-PAC PT "6 Clicks" Mobility   Outcome Measure  Help needed turning from your back to your side while in a flat bed without using bedrails?: None Help needed moving from lying on your back to sitting on the side of a flat bed without using bedrails?: A Little Help needed moving to and from a bed to a chair (including a wheelchair)?: A Little Help needed standing up from a chair using your arms (e.g., wheelchair or bedside chair)?: A Little Help needed to  walk in hospital room?: A Little Help needed climbing 3-5 steps with a railing? : A Lot 6 Click Score: 18    End of Session Equipment Utilized During Treatment: Gait belt Activity Tolerance: Patient tolerated treatment well Patient left: in chair;with call bell/phone within reach;with chair alarm set Nurse Communication: Mobility status PT Visit Diagnosis: Pain;Difficulty in walking, not elsewhere classified (R26.2) Pain - Right/Left: Left Pain - part of body: Leg     Time: 4854-6270 PT Time Calculation (min) (ACUTE ONLY): 23 min  Charges:  $Gait Training: 8-22 mins $Therapeutic Exercise: 8-22 mins                     Bonney Leitz , PTA Acute Rehabilitation Services Pager [phone] Office [phone]     Verlon Carcione Artis Delay 09/26/2019, 6:32 PM

## 2019-09-26 NOTE — Progress Notes (Signed)
PROGRESS NOTE  Diana Potts  DOB: September 19, 1940  PCP: Patient, No Pcp Per ZYY:[id number]  DOA: 09/23/2019  LOS: 3 days   Chief Complaint  Patient presents with   Hip Pain    left    Brief narrative: 79 year old female with history of COPD, chronic and stable, osteoporosis and anxiety.  Patient presented with mechanical fall and left femoral fracture.  She was admitted to hospital with acute traumatic femoral fracture Underwent ORIF on 7/17.  Subjective: Patient was seen and examined this morning.  Pleasant elderly Caucasian female.  Sitting up in chair.  Not in distress. Patient had another episode of dizziness today while going to the bathroom. She is thinking about home health versus SNF.  Assessment/Plan: Left hip fracture: -7/17, underwent ORIF. -Pain control  -Aspirin 325 mg twice daily for DVT prophylaxis per orthopedics.  Continue Protonix while on aspirin twice daily. -PT OT evaluation obtained.  Home health versus SNF recommended.  Orthostatic hypotension -Earlier yesterday, while getting up with PT, she got dizzy and her blood pressure dropped. -Likely orthostatic blood pressure drop. -500 mL of normal saline bolus was given.  Patient is also continued on normal saline at 75 mL/h. -Patient had another episode of dizziness today again.  I will continue saline hydration at this time.  COPD:  -Chronic and stable.  On Singulair, steroid inhalers and albuterol as needed. -Not on supplemental oxygen.  Anxiety:  -Chronic and stable.   -Takes Prozac.   -Occasionally uses Ativan as needed that she can use.  Mobility: PT OT evaluation Code Status:   Code Status: Full Code  Nutritional status: Body mass index is 18.56 kg/m.     Diet Order            Diet regular Room service appropriate? Yes; Fluid consistency: Thin  Diet effective now                 DVT prophylaxis: Aspirin 325 mg twice daily for DVT prophylaxis per orthopedics.   SCDs Start: 09/26/19  0802 SCDs Start: 09/23/19 2330   Antimicrobials:  None Fluid: None  Consultants: Orthopedics Family Communication:  None at bedside  Status is: Inpatient  Remains inpatient appropriate because:Unsafe d/c plan   Dispo:  Patient From: Home  Planned Disposition: Skilled Nursing Facility, most likely  Expected discharge date:  09/27/2019  Medically stable for discharge: No        Infusions:   sodium chloride 75 mL/hr at 09/25/19 1043    ceFAZolin (ANCEF) IV 2 g (09/26/19 0943)    Scheduled Meds:  acetaminophen  500 mg Oral Q6H   aspirin  325 mg Oral BID   cholecalciferol  2,000 Units Oral Daily   docusate sodium  100 mg Oral BID   dorzolamide-timolol  1 drop Both Eyes BID   FLUoxetine  10 mg Oral QHS   HYDROcodone-acetaminophen  1 tablet Oral Q6H   latanoprost  1 drop Both Eyes QHS   mometasone-formoterol  2 puff Inhalation BID   montelukast  10 mg Oral QHS   ondansetron (ZOFRAN) IV  4 mg Intravenous Once   pantoprazole  40 mg Oral Daily   senna  1 tablet Oral BID    Antimicrobials: Anti-infectives (From admission, onward)   Start     Dose/Rate Route Frequency Ordered Stop   09/26/19 0830  ceFAZolin (ANCEF) IVPB 2g/100 mL premix     Discontinue     2 g 200 mL/hr over 30 Minutes Intravenous Every 6 hours 09/26/19 0801 09/26/19  2029   09/24/19 0800  ceFAZolin (ANCEF) IVPB 2g/100 mL premix        2 g 200 mL/hr over 30 Minutes Intravenous  Once 09/24/19 0752 09/24/19 1008   09/24/19 0753  ceFAZolin (ANCEF) 2-4 GM/100ML-% IVPB       Note to Pharmacy: Shireen Quan   : cabinet override      09/24/19 0753 09/24/19 0938      PRN meds: albuterol, bisacodyl, HYDROcodone-acetaminophen, ibuprofen, LORazepam, menthol-cetylpyridinium, metoCLOPramide **OR** metoCLOPramide (REGLAN) injection, morphine injection, ondansetron **OR** ondansetron (ZOFRAN) IV, polyethylene glycol, sodium phosphate   Objective: Vitals:   09/26/19 0516 09/26/19 0859  BP: 113/75    Pulse: 87   Resp: 16   Temp: 98.7 F (37.1 C)   SpO2: 99% 97%    Intake/Output Summary (Last 24 hours) at 09/26/2019 1134 Last data filed at 09/25/2019 1900 Gross per 24 hour  Intake 720 ml  Output --  Net 720 ml   Filed Weights   09/23/19 2007  Weight: 52.2 kg   Weight change:  Body mass index is 18.56 kg/m.   Physical Exam: General exam: Appears calm and comfortable.  Not in physical distress Skin: No rashes, lesions or ulcers. HEENT: Atraumatic, normocephalic, supple neck, no obvious bleeding Lungs: Clear to auscultation bilaterally CVS: Regular rate and rhythm, no murmur GI/Abd soft, nontender, nondistended, bowel sound present CNS: Alert, awake, oriented x3. Psychiatry: Mood appropriate Extremities: No pedal edema, no calf tenderness  Data Review: I have personally reviewed the laboratory data and studies available.  Recent Labs  Lab 09/23/19 2141 09/25/19 0238  WBC 11.0* 7.2  NEUTROABS 8.9* 4.7  HGB 10.6* 8.9*  HCT 34.6* 28.3*  MCV 100.3* 99.0  PLT 330 228   Recent Labs  Lab 09/23/19 2141 09/25/19 0238  NA 143 141  K 3.5 3.3*  CL 107 110  CO2 25 26  GLUCOSE 116* 115*  BUN 10 9  CREATININE 0.69 0.65  CALCIUM 9.0 8.2*  MG  --  2.1  PHOS  --  3.6    Signed, Lorin Glass, MD [facility] Pager: [phone] (Secure Chat preferred). 09/26/2019

## 2019-09-26 NOTE — Plan of Care (Signed)
  Problem: Activity: Goal: Ability to avoid complications of mobility impairment will improve Outcome: Progressing Goal: Ability to tolerate increased activity will improve Outcome: Progressing   Problem: Education: Goal: Verbalization of understanding the information provided will improve Outcome: Progressing   Problem: Coping: Goal: Level of anxiety will decrease Outcome: Progressing   Problem: Physical Regulation: Goal: Postoperative complications will be avoided or minimized Outcome: Progressing   Problem: Respiratory: Goal: Ability to maintain a clear airway will improve Outcome: Progressing   Problem: Pain Management: Goal: Pain level will decrease Outcome: Progressing   Problem: Skin Integrity: Goal: Signs of wound healing will improve Outcome: Progressing   Problem: Tissue Perfusion: Goal: Ability to maintain adequate tissue perfusion will improve Outcome: Progressing   Problem: Education: Goal: Knowledge of General Education information will improve Description: Including pain rating scale, medication(s)/side effects and non-pharmacologic comfort measures Outcome: Progressing   Problem: Health Behavior/Discharge Planning: Goal: Ability to manage health-related needs will improve Outcome: Progressing   Problem: Clinical Measurements: Goal: Ability to maintain clinical measurements within normal limits will improve Outcome: Progressing Goal: Will remain free from infection Outcome: Progressing Goal: Diagnostic test results will improve Outcome: Progressing Goal: Respiratory complications will improve Outcome: Progressing Goal: Cardiovascular complication will be avoided Outcome: Progressing   Problem: Activity: Goal: Risk for activity intolerance will decrease Outcome: Progressing   Problem: Nutrition: Goal: Adequate nutrition will be maintained Outcome: Progressing   Problem: Coping: Goal: Level of anxiety will decrease Outcome: Progressing    Problem: Elimination: Goal: Will not experience complications related to bowel motility Outcome: Progressing Goal: Will not experience complications related to urinary retention Outcome: Progressing   Problem: Pain Managment: Goal: General experience of comfort will improve Outcome: Progressing   Problem: Safety: Goal: Ability to remain free from injury will improve Outcome: Progressing   Problem: Skin Integrity: Goal: Risk for impaired skin integrity will decrease Outcome: Progressing   

## 2019-09-26 NOTE — Progress Notes (Signed)
     Subjective: 2 Days Post-Op s/p Procedure(s): INTRAMEDULLARY (IM) NAIL, HIP  Patient reports mild pain to left hip. No other complaints. Patient is eager to get up with therapy today after feeling faint yesterday  Objective:  PE: VITALS:   Vitals:   09/25/19 1420 09/25/19 1913 09/25/19 2004 09/26/19 0516  BP: 105/62 109/67  113/75  Pulse: 75 86 83 87  Resp: 16 17 16 16   Temp: 97.9 F (36.6 C) 98.9 F (37.2 C)  98.7 F (37.1 C)  TempSrc: Oral Oral  Oral  SpO2: 97% 99% 98% 99%  Weight:      Height:       General: sitting up in bed eating breakfast, in no acute distress MSK: LLE -  Neurovascular intact Sensation intact distally Intact pulses distally Dorsiflexion/Plantar flexion intact Incision: dressing C/D/I  LABS  No results found for this or any previous visit (from the past 24 hour(s)).  DG C-Arm 1-60 Min  Result Date: 09/24/2019 CLINICAL DATA:  Left femoral intertrochanteric fracture. EXAM: DG HIP (WITH OR WITHOUT PELVIS) 2-3V LEFT; DG C-ARM 1-60 MIN COMPARISON:  02/23/2020 FINDINGS: Intraoperative fluoroscopic images demonstrate the left femoral intertrochanteric fracture. Intramedullary nail was placed in the left femur and interlocking screw was placed through the femoral head and neck. Intramedullary nail extends down to the distal femur. IMPRESSION: Internal fixation of the left femoral intertrochanteric fracture. Electronically Signed   By: 02/25/2020 M.D.   On: 09/24/2019 13:00   DG HIP UNILAT WITH PELVIS 2-3 VIEWS LEFT  Result Date: 09/24/2019 CLINICAL DATA:  Left femoral intertrochanteric fracture. EXAM: DG HIP (WITH OR WITHOUT PELVIS) 2-3V LEFT; DG C-ARM 1-60 MIN COMPARISON:  02/23/2020 FINDINGS: Intraoperative fluoroscopic images demonstrate the left femoral intertrochanteric fracture. Intramedullary nail was placed in the left femur and interlocking screw was placed through the femoral head and neck. Intramedullary nail extends down to the distal femur.  IMPRESSION: Internal fixation of the left femoral intertrochanteric fracture. Electronically Signed   By: 02/25/2020 M.D.   On: 09/24/2019 13:00    Assessment/Plan: Principal Problem:   Hip fracture (HCC) Active Problems:   Macrocytic anemia   COPD (chronic obstructive pulmonary disease) (HCC)   Anxiety  Left hip fracture:  2 Days Post-Op s/p Procedure(s): INTRAMEDULLARY (IM) NAIL, HIP Weightbearing: WBAT LLE, up with therapy Insicional and dressing care: reinforce as needed VTE prophylaxis: SCD's, aspirin Pain control: continue current regimen Follow - up plan: Follow-up with Dr. 09/26/2019 in 2 weeks Dispo: PT recommending Home Health PT. Patient lives at home with son and is main caretaker for him as he has history of stroke. She is unsure right now if Home with HHPT vs SNF would be the best option for her. TOC consult placed for home health vs SNF needs   Contact information:   Weekdays 8-5 11-11-2005, Janine Ores [phone] A fter hours and holidays please check [email or web address] for group call information for [facility]  [phone] 09/26/2019, 7:35 AM

## 2019-09-27 LAB — CBC WITH DIFFERENTIAL/PLATELET
Abs Immature Granulocytes: 0.05 10*3/uL (ref 0.00–0.07)
Basophils Absolute: 0 10*3/uL (ref 0.0–0.1)
Basophils Relative: 0 %
Eosinophils Absolute: 0.1 10*3/uL (ref 0.0–0.5)
Eosinophils Relative: 1 %
HCT: 26.3 % — ABNORMAL LOW (ref 36.0–46.0)
Hemoglobin: 8.2 g/dL — ABNORMAL LOW (ref 12.0–15.0)
Immature Granulocytes: 1 %
Lymphocytes Relative: 5 %
Lymphs Abs: 0.5 10*3/uL — ABNORMAL LOW (ref 0.7–4.0)
MCH: 30.8 pg (ref 26.0–34.0)
MCHC: 31.2 g/dL (ref 30.0–36.0)
MCV: 98.9 fL (ref 80.0–100.0)
Monocytes Absolute: 0.6 10*3/uL (ref 0.1–1.0)
Monocytes Relative: 6 %
Neutro Abs: 9.3 10*3/uL — ABNORMAL HIGH (ref 1.7–7.7)
Neutrophils Relative %: 87 %
Platelets: 237 10*3/uL (ref 150–400)
RBC: 2.66 MIL/uL — ABNORMAL LOW (ref 3.87–5.11)
RDW: 13.7 % (ref 11.5–15.5)
WBC: 10.7 10*3/uL — ABNORMAL HIGH (ref 4.0–10.5)
nRBC: 0 % (ref 0.0–0.2)

## 2019-09-27 LAB — MAGNESIUM: Magnesium: 2 mg/dL (ref 1.7–2.4)

## 2019-09-27 LAB — BASIC METABOLIC PANEL
Anion gap: 7 (ref 5–15)
BUN: 8 mg/dL (ref 8–23)
CO2: 25 mmol/L (ref 22–32)
Calcium: 8.2 mg/dL — ABNORMAL LOW (ref 8.9–10.3)
Chloride: 108 mmol/L (ref 98–111)
Creatinine, Ser: 0.52 mg/dL (ref 0.44–1.00)
GFR calc Af Amer: >60 mL/min (ref >60)
GFR calc non Af Amer: >60 mL/min (ref >60)
Glucose, Bld: 109 mg/dL — ABNORMAL HIGH (ref 70–99)
Potassium: 3.9 mmol/L (ref 3.5–5.1)
Sodium: 140 mmol/L (ref 135–145)

## 2019-09-27 LAB — PHOSPHORUS: Phosphorus: 3 mg/dL (ref 2.5–4.6)

## 2019-09-27 NOTE — TOC Initial Note (Addendum)
Transition of Care [facility]) - Initial/Assessment Note    Patient Details  Name: Diana Potts MRN: [id number] Date of Birth: 02-11-1941  Transition of Care [facility]) CM/SW Contact:    Epifanio Lesches, RN Phone Number: 09/27/2019, 9:14 AM  Clinical Narrative:   Presents s/p fall. Suffered Left intertrochanteric fracture.        - s/p   IM nailing of L hip         Hx of  anal cancer, COPD, osteoporosis, anxiety.  NCM spoke with pt @ bedside regarding d/c planning. Shared PT evaluation recommendations:Home health PT;Supervision for mobility/OOB. Pt resides with 51y/o son who has had a stroke. States son does have some residual from stroke and would be limited in assisting her once d/c. States has good family and friend support. Pt agreeable to home health services. Pt without preference. Referral made with [facility]Mar[facility] Kitchen. acceptance pending. PTA pt utilized no DME. NCM has made referral with [employer] for rolling walker and 3 in 1/BSC.  Pt states has 4 steps to get inside of home ... concern. Thinks will probably need non emergent ambulance service for transportation to home. NCM will f/u with PT.  TOC team will continue to follow for needs.....  7/22 Bed offers noted and shared with pt, pt accepted [facility]  Expected Discharge Plan: Home w Home Health Services Barriers to Discharge: Continued Medical Work up   Patient Goals and CMS Choice Patient states their goals for this hospitalization and ongoing recovery are:: to get better   Choice offered to / list presented to : Patient  Expected Discharge Plan and Services Expected Discharge Plan: Home w Home Health Services                                              Prior Living Arrangements/Services                       Activities of Daily Living Home Assistive Devices/Equipment: None ADL Screening (condition at time of admission) Patient's cognitive ability adequate to safely complete daily activities?:  Yes Is the patient deaf or have difficulty hearing?: No Does the patient have difficulty seeing, even when wearing glasses/contacts?: No Does the patient have difficulty concentrating, remembering, or making decisions?: No Patient able to express need for assistance with ADLs?: Yes Does the patient have difficulty dressing or bathing?: No Independently performs ADLs?: Yes (appropriate for developmental age) Does the patient have difficulty walking or climbing stairs?: No Weakness of Legs: None Weakness of Arms/Hands: None  Permission Sought/Granted                  Emotional Assessment              Admission diagnosis:  Hip fracture (HCC) [S72.009A] Patient Active Problem List   Diagnosis Date Noted  . Macrocytic anemia 09/24/2019  . COPD (chronic obstructive pulmonary disease) (HCC) 09/24/2019  . Anxiety 09/24/2019  . Hip fracture (HCC) 09/23/2019   PCP:  Patient, No Pcp Per Pharmacy:   [facility] Ginette Otto, [place] - [address] [place] [address] Phone: [phone] Fax: [phone]     Social Determinants of Health (SDOH) Interventions    Readmission Risk Interventions No flowsheet data found.

## 2019-09-27 NOTE — Progress Notes (Signed)
RE:  Diana Potts Date of Birth: [birth year] Date: 09/27/2019  Please be advised that the above-named patient will require a short-term nursing home stay - anticipated 30 days or less for rehabilitation and strengthening.  The plan is for return home.

## 2019-09-27 NOTE — Plan of Care (Signed)
  Problem: Activity: Goal: Ability to avoid complications of mobility impairment will improve Outcome: Progressing Goal: Ability to tolerate increased activity will improve Outcome: Progressing   Problem: Education: Goal: Verbalization of understanding the information provided will improve Outcome: Progressing   Problem: Coping: Goal: Level of anxiety will decrease Outcome: Progressing   Problem: Physical Regulation: Goal: Postoperative complications will be avoided or minimized Outcome: Progressing   Problem: Respiratory: Goal: Ability to maintain a clear airway will improve Outcome: Progressing   Problem: Pain Management: Goal: Pain level will decrease Outcome: Progressing   Problem: Skin Integrity: Goal: Signs of wound healing will improve Outcome: Progressing   Problem: Tissue Perfusion: Goal: Ability to maintain adequate tissue perfusion will improve Outcome: Progressing   Problem: Education: Goal: Knowledge of General Education information will improve Description: Including pain rating scale, medication(s)/side effects and non-pharmacologic comfort measures Outcome: Progressing   Problem: Health Behavior/Discharge Planning: Goal: Ability to manage health-related needs will improve Outcome: Progressing   Problem: Clinical Measurements: Goal: Ability to maintain clinical measurements within normal limits will improve Outcome: Progressing Goal: Will remain free from infection Outcome: Progressing Goal: Diagnostic test results will improve Outcome: Progressing Goal: Respiratory complications will improve Outcome: Progressing Goal: Cardiovascular complication will be avoided Outcome: Progressing   Problem: Activity: Goal: Risk for activity intolerance will decrease Outcome: Progressing   Problem: Nutrition: Goal: Adequate nutrition will be maintained Outcome: Progressing   Problem: Coping: Goal: Level of anxiety will decrease Outcome: Progressing    Problem: Elimination: Goal: Will not experience complications related to bowel motility Outcome: Progressing Goal: Will not experience complications related to urinary retention Outcome: Progressing   Problem: Pain Managment: Goal: General experience of comfort will improve Outcome: Progressing   Problem: Safety: Goal: Ability to remain free from injury will improve Outcome: Progressing   Problem: Skin Integrity: Goal: Risk for impaired skin integrity will decrease Outcome: Progressing   

## 2019-09-27 NOTE — Progress Notes (Signed)
Physical Therapy Treatment Patient Details Name: Diana Potts MRN: [id number] DOB: 1940-11-22 Today's Date: 09/27/2019    History of Present Illness Patient is a 79 year old female medical history significant for hx of anal cancer, COPD, osteoporosis, anxiety who presents following a mechanical fall sustaining L hip fracture. Patient s/p L IM nail.     PT Comments    Pt having difficulty with mobility due to dizziness with standing. Attempted standing numerous times today and transferred to and from [facility] but was unable to maintain standing more than 2 minutes without having to sit due to dizziness. BP in sitting 109/53 and sitting right after standing (she could not maintain standing long enough to get a standing BP) 106/56. Do not feel that she is safe at this time to return home, especially since she was caring for her son who has had a CVA. Recommend short term rehab. Pt agreeable. PT will continue to follow.    Follow Up Recommendations  SNF;Supervision/Assistance - 24 hour     Equipment Recommendations  Rolling walker with 5" wheels;3in1 (PT)    Recommendations for Other Services       Precautions / Restrictions Precautions Precautions: Fall Restrictions Weight Bearing Restrictions: No LLE Weight Bearing: Weight bearing as tolerated    Mobility  Bed Mobility               General bed mobility comments: received in chair  Transfers Overall transfer level: Needs assistance Equipment used: Rolling walker (2 wheeled) Transfers: Sit to/from Stand Sit to Stand: Min assist Stand pivot transfers: Min assist       General transfer comment: pt with sufficient strength for power up, min A given to steady as pt was dizzy with each bout of standing  Ambulation/Gait             General Gait Details: dizziness prevented ambulation today   Stairs             Wheelchair Mobility    Modified Rankin (Stroke Patients Only)       Balance Overall balance  assessment: Needs assistance Sitting-balance support: No upper extremity supported;Feet supported Sitting balance-Leahy Scale: Good     Standing balance support: Bilateral upper extremity supported;During functional activity Standing balance-Leahy Scale: Poor Standing balance comment: reliant on UE support due to dizziness                            Cognition Arousal/Alertness: Awake/alert Behavior During Therapy: WFL for tasks assessed/performed Overall Cognitive Status: Within Functional Limits for tasks assessed                                 General Comments: pt relays that she doesn't know that she will be safe at home. Son is there but she had been helping him since CVA 2 yrs ago      Exercises General Exercises - Lower Extremity Ankle Circles/Pumps: AROM;Both;10 reps;Seated Quad Sets: AROM;10 reps;Seated Long Arc Quad: 10 reps;Seated;Left Heel Slides: AROM;Left;10 reps;Seated Hip ABduction/ADduction: Left;10 reps;AAROM;Seated Straight Leg Raises: AAROM;Left;5 reps;Seated    General Comments General comments (skin integrity, edema, etc.): attempted standing numerous times and worked on pivoting in the hopes that dizziness would decrease but she could not maintain standing more than 2 mins at a time without having to sit.       Pertinent Vitals/Pain Pain Assessment: Faces Faces Pain Scale: Hurts  little more Pain Location: L hip Pain Descriptors / Indicators: Sore Pain Intervention(s): Limited activity within patient's tolerance;Monitored during session    Home Living                      Prior Function            PT Goals (current goals can now be found in the care plan section) Acute Rehab PT Goals Patient Stated Goal: return to doing yoga PT Goal Formulation: With patient Time For Goal Achievement: 10/09/19 Potential to Achieve Goals: Good Progress towards PT goals: Not progressing toward goals - comment (dizziness)     Frequency    Min 3X/week      PT Plan Discharge plan needs to be updated    Co-evaluation              AM-PAC PT "6 Clicks" Mobility   Outcome Measure  Help needed turning from your back to your side while in a flat bed without using bedrails?: None Help needed moving from lying on your back to sitting on the side of a flat bed without using bedrails?: A Little Help needed moving to and from a bed to a chair (including a wheelchair)?: A Little Help needed standing up from a chair using your arms (e.g., wheelchair or bedside chair)?: A Little Help needed to walk in hospital room?: A Lot Help needed climbing 3-5 steps with a railing? : Total 6 Click Score: 16    End of Session Equipment Utilized During Treatment: Gait belt Activity Tolerance: Treatment limited secondary to medical complications (Comment) (dizziness) Patient left: in chair;with call bell/phone within reach;with chair alarm set Nurse Communication: Mobility status PT Visit Diagnosis: Pain;Difficulty in walking, not elsewhere classified (R26.2) Pain - Right/Left: Left Pain - part of body: Leg     Time: 1104-1140 PT Time Calculation (min) (ACUTE ONLY): 36 min  Charges:  $Therapeutic Exercise: 8-22 mins $Therapeutic Activity: 8-22 mins                     Lyanne Co, PT  Acute Rehab Services  Pager [phone] Office [phone]    Diana Potts 09/27/2019, 1:18 PM

## 2019-09-27 NOTE — Progress Notes (Signed)
Physical Therapy Treatment Patient Details Name: Diana Potts MRN: [id number] DOB: 05/25/1940 Today's Date: 09/27/2019    History of Present Illness Patient is a 79 year old female medical history significant for hx of anal cancer, COPD, osteoporosis, anxiety who presents following a mechanical fall sustaining L hip fracture. Patient s/p L IM nail.     PT Comments    AM session limited by pt dizziness. Pt felt if she could eat she would possibly feel better. Returned in PM after lunch and pt dizziness was better and she was able to ambulate but pt now with diarrhea and had to sit on [facility] after only 15'. Pt continues to fatigue very quickly and needing consistent min A for very short distance mobility. Continue to feel that post acute rehab in SNF setting is safest option for her at this point. PT will continue to follow.    Follow Up Recommendations  SNF;Supervision/Assistance - 24 hour     Equipment Recommendations  Rolling walker with 5" wheels;3in1 (PT)    Recommendations for Other Services       Precautions / Restrictions Precautions Precautions: Fall Restrictions Weight Bearing Restrictions: No LLE Weight Bearing: Weight bearing as tolerated    Mobility  Bed Mobility Overal bed mobility: Needs Assistance Bed Mobility: Sit to Supine     Supine to sit: Supervision Sit to supine: Min assist   General bed mobility comments: pt able to come to EOB with use of hands to assist LLE, min A to LLE for return to supine  Transfers Overall transfer level: Needs assistance Equipment used: Rolling walker (2 wheeled) Transfers: Sit to/from Stand Sit to Stand: Min assist Stand pivot transfers: Min assist       General transfer comment: min A to steady  Ambulation/Gait Ambulation/Gait assistance: Min assist Gait Distance (Feet): 15 Feet (2x) Assistive device: Rolling walker (2 wheeled) Gait Pattern/deviations: Step-to pattern;Trunk flexed;Antalgic;Decreased stride  length;Decreased stance time - left;Decreased weight shift to left Gait velocity: decreased Gait velocity interpretation: <1.31 ft/sec, indicative of household ambulator General Gait Details: first visit limited by dizziness so sae pt again this afternoon after she had eaten. Dizziness better but pt now with abdominal pain and diarrhea. Could ambulate only 15' before needing to sit on [facility]   Stairs             Wheelchair Mobility    Modified Rankin (Stroke Patients Only)       Balance Overall balance assessment: Needs assistance Sitting-balance support: No upper extremity supported;Feet supported Sitting balance-Leahy Scale: Good     Standing balance support: Bilateral upper extremity supported;During functional activity Standing balance-Leahy Scale: Poor Standing balance comment: reliant on UE support of at least one hand                            Cognition Arousal/Alertness: Awake/alert Behavior During Therapy: WFL for tasks assessed/performed Overall Cognitive Status: Within Functional Limits for tasks assessed                                 General Comments: pt relays that she doesn't know that she will be safe at home. Son is there but she had been helping him since CVA 2 yrs ago      Exercises General Exercises - Lower Extremity Ankle Circles/Pumps: AROM;Both;10 reps;Seated Quad Sets: AROM;10 reps;Seated Long Arc Quad: 10 reps;Seated;Left Heel Slides: AROM;Left;10 reps;Seated Hip ABduction/ADduction:  Left;10 reps;AAROM;Seated Straight Leg Raises: AAROM;Left;5 reps;Seated Hip Flexion/Marching: AROM;Both;Limitations;10 reps;Standing Hip Flexion/Marching Limitations: had difficulty lifting RLE and shifting wt onto L    General Comments General comments (skin integrity, edema, etc.): attempted standing numerous times and worked on pivoting in the hopes that dizziness would decrease but she could not maintain standing more than 2 mins at  a time without having to sit.       Pertinent Vitals/Pain Pain Assessment: Faces Faces Pain Scale: Hurts little more Pain Location: L hip and abdomen Pain Descriptors / Indicators: Sore Pain Intervention(s): Limited activity within patient's tolerance;Monitored during session    Home Living                      Prior Function            PT Goals (current goals can now be found in the care plan section) Acute Rehab PT Goals Patient Stated Goal: return to doing yoga PT Goal Formulation: With patient Time For Goal Achievement: 10/09/19 Potential to Achieve Goals: Good Progress towards PT goals: Progressing toward goals    Frequency    Min 3X/week      PT Plan Current plan remains appropriate    Co-evaluation              AM-PAC PT "6 Clicks" Mobility   Outcome Measure  Help needed turning from your back to your side while in a flat bed without using bedrails?: None Help needed moving from lying on your back to sitting on the side of a flat bed without using bedrails?: A Little Help needed moving to and from a bed to a chair (including a wheelchair)?: A Little Help needed standing up from a chair using your arms (e.g., wheelchair or bedside chair)?: A Little Help needed to walk in hospital room?: A Lot Help needed climbing 3-5 steps with a railing? : Total 6 Click Score: 16    End of Session Equipment Utilized During Treatment: Gait belt Activity Tolerance: Patient limited by fatigue (dizziness) Patient left: with call bell/phone within reach;in bed Nurse Communication: Mobility status PT Visit Diagnosis: Pain;Difficulty in walking, not elsewhere classified (R26.2) Pain - Right/Left: Left Pain - part of body: Leg     Time: 1520-1546 PT Time Calculation (min) (ACUTE ONLY): 26 min  Charges:  $Gait Training: 8-22 mins $Therapeutic Exercise: 8-22 mins $Therapeutic Activity: 8-22 mins                     Lyanne Co, PT  Acute Rehab  Services  Pager [phone] Office [phone]    Diana Potts Diana Potts 09/27/2019, 3:55 PM

## 2019-09-27 NOTE — Progress Notes (Signed)
PROGRESS NOTE  Charron Coultas  DOB: May 14, 1940  PCP: Patient, No Pcp Per NFA:[id number]  DOA: 09/23/2019  LOS: 4 days   Chief Complaint  Patient presents with  . Hip Pain    left    Brief narrative: 79 year old female with history of COPD, chronic and stable, osteoporosis and anxiety.  Patient presented with mechanical fall and left femoral fracture.  She was admitted to hospital with acute traumatic femoral fracture Underwent ORIF on 7/17.  Subjective: Patient was seen and examined this morning.  Lying on bed.  Not in distress.  Feels better after having appointment today.   SNF work-up pending Labs from this morning with normal BMP, hemoglobin gradually down, 8.2 today.  Assessment/Plan: Left hip fracture: -7/17, underwent ORIF. -Continue pain control with Tylenol, Norco. -Aspirin 325 mg twice daily for DVT prophylaxis per orthopedics.  Continue Protonix while on aspirin twice daily. -PT OT evaluation obtained.  Home health versus SNF recommended.  Orthostatic hypotension -Patient has had episodes of dizziness and low blood pressure on getting up.   -Currently on normal saline at 75 mL/h which we will continue -Continue to work with physical therapy.  Anticipated postoperative drop in hemoglobin -Her hemoglobin was 10.6 on admission on 7/16. -Postoperatively remains down from 8.2 today. -No active bleeding. Continue to monitor hemoglobin.  COPD:  -Chronic and stable. On Singulair, steroid inhalers and albuterol as needed. -Not on supplemental oxygen.  Anxiety:  -Chronic and stable.   -Takes Prozac.   -Occasionally uses Ativan as needed that she can use.  Mobility: PT OT evaluation Code Status:   Code Status: Full Code  Nutritional status: Body mass index is 18.56 kg/m.     Diet Order            Diet regular Room service appropriate? Yes; Fluid consistency: Thin  Diet effective now                 DVT prophylaxis: Aspirin 325 mg twice daily for DVT  prophylaxis per orthopedics.   SCDs Start: 09/26/19 0802 SCDs Start: 09/23/19 2330   Antimicrobials:  None Fluid: None  Consultants: Orthopedics Family Communication:  None at bedside  Status is: Inpatient  Remains inpatient appropriate because:Unsafe d/c plan  Dispo:  Patient From: Home  Planned Disposition: Skilled Nursing Facility, most likely  Expected discharge date:  09/27/2019  Medically stable for discharge: No   Infusions:  . sodium chloride 75 mL/hr at 09/25/19 1043    Scheduled Meds: . aspirin  325 mg Oral BID  . cholecalciferol  2,000 Units Oral Daily  . docusate sodium  100 mg Oral BID  . dorzolamide-timolol  1 drop Both Eyes BID  . FLUoxetine  10 mg Oral QHS  . HYDROcodone-acetaminophen  1 tablet Oral Q6H  . latanoprost  1 drop Both Eyes QHS  . mometasone-formoterol  2 puff Inhalation BID  . montelukast  10 mg Oral QHS  . ondansetron (ZOFRAN) IV  4 mg Intravenous Once  . pantoprazole  40 mg Oral Daily  . senna  1 tablet Oral BID    Antimicrobials: Anti-infectives (From admission, onward)   Start     Dose/Rate Route Frequency Ordered Stop   09/26/19 0830  ceFAZolin (ANCEF) IVPB 2g/100 mL premix        2 g 200 mL/hr over 30 Minutes Intravenous Every 6 hours 09/26/19 0801 09/26/19 1545   09/24/19 0800  ceFAZolin (ANCEF) IVPB 2g/100 mL premix        2 g 200 mL/hr  over 30 Minutes Intravenous  Once 09/24/19 0752 09/24/19 1008   09/24/19 0753  ceFAZolin (ANCEF) 2-4 GM/100ML-% IVPB       Note to Pharmacy: Shireen Quan   : cabinet override      09/24/19 0753 09/24/19 0938      PRN meds: albuterol, bisacodyl, HYDROcodone-acetaminophen, ibuprofen, LORazepam, menthol-cetylpyridinium, metoCLOPramide **OR** metoCLOPramide (REGLAN) injection, morphine injection, ondansetron **OR** ondansetron (ZOFRAN) IV, polyethylene glycol, sodium phosphate   Objective: Vitals:   09/27/19 0324 09/27/19 0818  BP: (!) 121/53 (!) 108/46  Pulse: 84 87  Resp: 16 16  Temp: 99  F (37.2 C)   SpO2: 95% 90%    Intake/Output Summary (Last 24 hours) at 09/27/2019 1056 Last data filed at 09/26/2019 2200 Gross per 24 hour  Intake 440 ml  Output --  Net 440 ml   Filed Weights   09/23/19 2007  Weight: 52.2 kg   Weight change:  Body mass index is 18.56 kg/m.   Physical Exam: General exam: Appears calm and comfortable.  Not in physical distress Skin: No rashes, lesions or ulcers. HEENT: Atraumatic, normocephalic, supple neck, no obvious bleeding Lungs: Clear to auscultation bilaterally CVS: Regular rate and rhythm, no murmur GI/Abd soft, nontender, nondistended, bowel sound present CNS: Alert, awake, oriented x3. Psychiatry: Mood appropriate Extremities: No pedal edema, no calf tenderness  Data Review: I have personally reviewed the laboratory data and studies available.  Recent Labs  Lab 09/23/19 2141 09/25/19 0238 09/27/19 0857  WBC 11.0* 7.2 10.7*  NEUTROABS 8.9* 4.7 9.3*  HGB 10.6* 8.9* 8.2*  HCT 34.6* 28.3* 26.3*  MCV 100.3* 99.0 98.9  PLT 330 228 237   Recent Labs  Lab 09/23/19 2141 09/25/19 0238 09/27/19 0857  NA 143 141 140  K 3.5 3.3* 3.9  CL 107 110 108  CO2 25 26 25   GLUCOSE 116* 115* 109*  BUN 10 9 8   CREATININE 0.69 0.65 0.52  CALCIUM 9.0 8.2* 8.2*  MG  --  2.1 2.0  PHOS  --  3.6 3.0    Signed, , MD [facility] Pager: [phone] (Secure Chat preferred). 09/27/2019

## 2019-09-27 NOTE — TOC CAGE-AID Note (Signed)
Transition of Care [facility]) - CAGE-AID Screening   Patient Details  Name: Diana Potts MRN: [id number] Date of Birth: Aug 24, 1940  Transition of [facility]) CM/SW Contact:    Emeterio Reeve, [place] Phone Number: 09/27/2019, 12:38 PM   Clinical Narrative:  CSW met with pt at bedside. CSW introduced self and explained her role at the hospital.  Pt reports having wine about once a month. Pt denies substance use. Pt did not need resources at this time.   CAGE-AID Screening:    Have You Ever Felt You Ought to Cut Down on Your Drinking or Drug Use?: No Have People Annoyed You By Critizing Your Drinking Or Drug Use?: No Have You Felt Bad Or Guilty About Your Drinking Or Drug Use?: No Have You Ever Had a Drink or Used Drugs First Thing In The Morning to Steady Your Nerves or to Get Rid of a Hangover?: No CAGE-AID Score: 0  Substance Abuse Education Offered: Yes  Substance abuse interventions: Patient Counseling   Emeterio Reeve, Latanya Presser, [place] Social Worker [phone]

## 2019-09-27 NOTE — Progress Notes (Signed)
Nurse was called to the room- the patient is requesting to have medications ordered for diarrhea.  The patient was constipated over the last 2 days and was given a suppository early this morning with results.  I have encouraged the patient to refuse stool softener tonight and not to take the laxative tonight. The patient states that her rectum is sore.  Peri care done and there is not broken skin areas noted.

## 2019-09-27 NOTE — NC FL2 (Signed)
[place] MEDICAID FL2 LEVEL OF CARE SCREENING TOOL     IDENTIFICATION  Patient Name: Diana Potts Birthdate: 10/13/1940 Sex: female Admission Date (Current Location): 09/23/2019  [facility] and [place] Number:  Producer, television/film/video and Address:  [facility], [address], [place], [place] [address]      Provider Number: [id number]  Attending Physician Name and Address:  Lorin Glass, MD  Relative Name and Phone Number:       Current Level of Care: Hospital Recommended Level of Care: Skilled Nursing Facility Prior Approval Number:    Date Approved/Denied:   PASRR Number: pending  Discharge Plan: SNF    Current Diagnoses: Patient Active Problem List   Diagnosis Date Noted  . Macrocytic anemia 09/24/2019  . COPD (chronic obstructive pulmonary disease) (HCC) 09/24/2019  . Anxiety 09/24/2019  . Hip fracture (HCC) 09/23/2019    Orientation RESPIRATION BLADDER Height & Weight     Self, Time, Situation, Place  Normal Continent Weight: 52.2 kg Height:  5\' 6"  (167.6 cm)  BEHAVIORAL SYMPTOMS/MOOD NEUROLOGICAL BOWEL NUTRITION STATUS      Continent Diet (see discharge summary)  AMBULATORY STATUS COMMUNICATION OF NEEDS Skin   Extensive Assist Verbally Surgical wounds (closed incision on left thigh with adhesive bandage)                       Personal Care Assistance Level of Assistance  Dressing, Bathing, Feeding Bathing Assistance: Maximum assistance Feeding assistance: Independent Dressing Assistance: Maximum assistance     Functional Limitations Info  Sight, Hearing, Speech Sight Info: Adequate Hearing Info: Adequate Speech Info: Adequate    SPECIAL CARE FACTORS FREQUENCY  PT (By licensed PT), OT (By licensed OT)     PT Frequency: 5x week OT Frequency: 5x week            Contractures Contractures Info: Not present    Additional Factors Info  Code Status, Allergies, Psychotropic Code Status Info: Full Code Allergies  Info: No Known Allergies Psychotropic Info: FLUoxetine (PROZAC) capsule 10 mg daily PO         Current Medications (09/27/2019):  This is the current hospital active medication list Current Facility-Administered Medications  Medication Dose Route Frequency Provider Last Rate Last Admin  . 0.9 %  sodium chloride infusion   Intravenous Continuous 09/29/2019, MD 75 mL/hr at 09/25/19 1043 New Bag at 09/25/19 1043  . albuterol (PROVENTIL) (2.5 MG/3ML) 0.083% nebulizer solution 3 mL  3 mL Inhalation Q6H PRN 09/27/19, MD      . aspirin tablet 325 mg  325 mg Oral BID Sheral Apley K, PA-C   325 mg at 09/27/19 0849  . bisacodyl (DULCOLAX) suppository 10 mg  10 mg Rectal Daily PRN 09/29/19, MD      . cholecalciferol (VITAMIN D3) tablet 2,000 Units  2,000 Units Oral Daily Sheral Apley, MD   2,000 Units at 09/27/19 [phone]  . docusate sodium (COLACE) capsule 100 mg  100 mg Oral BID 2774, MD   100 mg at 09/27/19 0849  . dorzolamide-timolol (COSOPT) 22.3-6.8 MG/ML ophthalmic solution 1 drop  1 drop Both Eyes BID 09/29/19, MD   1 drop at 09/27/19 1121  . FLUoxetine (PROZAC) capsule 10 mg  10 mg Oral QHS 09/29/19, MD   10 mg at 09/26/19 2055  . HYDROcodone-acetaminophen (NORCO/VICODIN) 5-325 MG per tablet 1 tablet  1 tablet Oral Q6H 2056, MD  1 tablet at 09/27/19 1119  . HYDROcodone-acetaminophen (NORCO/VICODIN) 5-325 MG per tablet 1-2 tablet  1-2 tablet Oral Q4H PRN Sheral Apley, MD   2 tablet at 09/26/19 2252  . ibuprofen (ADVIL) tablet 600 mg  600 mg Oral Q6H PRN Dorcas Carrow, MD   600 mg at 09/27/19 0850  . latanoprost (XALATAN) 0.005 % ophthalmic solution 1 drop  1 drop Both Eyes QHS Dorcas Carrow, MD   1 drop at 09/26/19 2058  . LORazepam (ATIVAN) tablet 0.25 mg  0.25 mg Oral BID PRN Sheral Apley, MD      . menthol-cetylpyridinium (CEPACOL) lozenge 3 mg  1 lozenge Oral PRN Dorcas Carrow, MD   3 mg at 09/24/19 1637  .  metoCLOPramide (REGLAN) tablet 5-10 mg  5-10 mg Oral Q8H PRN Sheral Apley, MD       Or  . metoCLOPramide (REGLAN) injection 5-10 mg  5-10 mg Intravenous Q8H PRN Sheral Apley, MD      . mometasone-formoterol [facility]) 200-5 MCG/ACT inhaler 2 puff  2 puff Inhalation BID Sheral Apley, MD   2 puff at 09/26/19 1923  . montelukast (SINGULAIR) tablet 10 mg  10 mg Oral QHS Sheral Apley, MD   10 mg at 09/26/19 2054  . morphine 2 MG/ML injection 0.5 mg  0.5 mg Intravenous Q2H PRN Sheral Apley, MD      . ondansetron [facility]) injection 4 mg  4 mg Intravenous Once Sheral Apley, MD      . ondansetron [facility]) tablet 4 mg  4 mg Oral Q6H PRN Sheral Apley, MD       Or  . ondansetron [facility]) injection 4 mg  4 mg Intravenous Q6H PRN Sheral Apley, MD   4 mg at 09/27/19 0551  . pantoprazole (PROTONIX) EC tablet 40 mg  40 mg Oral Daily Dahal, Melina Schools, MD   40 mg at 09/27/19 0849  . polyethylene glycol (MIRALAX / GLYCOLAX) packet 17 g  17 g Oral Daily PRN Sheral Apley, MD      . senna [facility]) tablet 8.6 mg  1 tablet Oral BID Sheral Apley, MD   8.6 mg at 09/27/19 0850  . sodium phosphate (FLEET) 7-19 GM/118ML enema 1 enema  1 enema Rectal Once PRN Sheral Apley, MD         Discharge Medications: Please see discharge summary for a list of discharge medications.  Relevant Imaging Results:  Relevant Lab Results:   Additional Information [id number] [place], [place]

## 2019-09-28 LAB — CBC WITH DIFFERENTIAL/PLATELET
Abs Immature Granulocytes: 0.05 10*3/uL (ref 0.00–0.07)
Basophils Absolute: 0.1 10*3/uL (ref 0.0–0.1)
Basophils Relative: 1 %
Eosinophils Absolute: 0.4 10*3/uL (ref 0.0–0.5)
Eosinophils Relative: 3 %
HCT: 26.8 % — ABNORMAL LOW (ref 36.0–46.0)
Hemoglobin: 8.4 g/dL — ABNORMAL LOW (ref 12.0–15.0)
Immature Granulocytes: 1 %
Lymphocytes Relative: 10 %
Lymphs Abs: 1.1 10*3/uL (ref 0.7–4.0)
MCH: 31.3 pg (ref 26.0–34.0)
MCHC: 31.3 g/dL (ref 30.0–36.0)
MCV: 100 fL (ref 80.0–100.0)
Monocytes Absolute: 0.5 10*3/uL (ref 0.1–1.0)
Monocytes Relative: 5 %
Neutro Abs: 8.3 10*3/uL — ABNORMAL HIGH (ref 1.7–7.7)
Neutrophils Relative %: 80 %
Platelets: 294 10*3/uL (ref 150–400)
RBC: 2.68 MIL/uL — ABNORMAL LOW (ref 3.87–5.11)
RDW: 13.8 % (ref 11.5–15.5)
WBC: 10.3 10*3/uL (ref 4.0–10.5)
nRBC: 0 % (ref 0.0–0.2)

## 2019-09-28 MED ORDER — LOPERAMIDE HCL 2 MG PO CAPS
2.0000 mg | ORAL_CAPSULE | Freq: Four times a day (QID) | ORAL | Status: DC | PRN
  Filled 2019-09-28: qty 1

## 2019-09-28 NOTE — Progress Notes (Signed)
Occupational Therapy Treatment Patient Details Name: Diana Potts MRN: [id number] DOB: March 18, 1940 Today's Date: 09/28/2019    History of present illness Patient is a 79 year old female medical history significant for hx of anal cancer, COPD, osteoporosis, anxiety who presents following a mechanical fall sustaining L hip fracture. Patient s/p L IM nail.    OT comments  Patient met lying supine in bed in agreement with OT treatment session with focus on bed mobility, functional transfers, and self-care re-education. Patient with increased awareness this date with no c/o lightheadedness/dizziness with change in position. BP 118/58 seated EOB and 104/60 in standing. Patient making great progress toward goals and would benefit from continued acute OT to increase safety and independence with self-care tasks in prep for d/c to next level of care.     Follow Up Recommendations  SNF;Supervision/Assistance - 24 hour;Home health OT    Equipment Recommendations  3 in 1 bedside commode;Other (comment)    Recommendations for Other Services      Precautions / Restrictions Precautions Precautions: Fall Restrictions Weight Bearing Restrictions: No LLE Weight Bearing: Weight bearing as tolerated       Mobility Bed Mobility Overal bed mobility: Needs Assistance Bed Mobility: Sit to Supine     Supine to sit: Supervision        Transfers Overall transfer level: Needs assistance Equipment used: Rolling walker (2 wheeled) Transfers: Sit to/from Stand Sit to Stand: Min assist Stand pivot transfers: Min assist (Min A progressing toward Min guard. )       General transfer comment: Min A progressing toward Min guard with functional transfers using RW.     Balance           Standing balance support: Bilateral upper extremity supported;During functional activity Standing balance-Leahy Scale: Poor Standing balance comment: reliant on UE support of at least one hand                            ADL either performed or assessed with clinical judgement   ADL Overall ADL's : Needs assistance/impaired                         Toilet Transfer: Minimal assistance (Light min A progressing toward Min guard) Toilet Transfer Details (indicate cue type and reason): To BSC ~28f away from EOB. Cues for walker safety and sequencing.  Toileting- Clothing Manipulation and Hygiene: Min guard Toileting - Clothing Manipulation Details (indicate cue type and reason): Pt. completed hygiene/clothing management in sitting/standing with Min guard to maintain balance.      Functional mobility during ADLs: Min guard;Rolling walker General ADL Comments: [facility]. [facility] and occasional cues for use of RW.      Vision       Perception     Praxis      Cognition Arousal/Alertness: Awake/alert Behavior During Therapy: WFL for tasks assessed/performed Overall Cognitive Status: Within Functional Limits for tasks assessed                                          Exercises     Shoulder Instructions       General Comments      Pertinent Vitals/ Pain       Pain Assessment: 0-10 Pain Score: 2  (4/10 with ambulation) Pain Location:  L hip Pain Descriptors / Indicators: Sore Pain Intervention(s): Monitored during session  Home Living                                          Prior Functioning/Environment              Frequency  Min 2X/week        Progress Toward Goals  OT Goals(current goals can now be found in the care plan section)  Progress towards OT goals: Progressing toward goals  Acute Rehab OT Goals Patient Stated Goal: To return home.  OT Goal Formulation: With patient Time For Goal Achievement: 10/09/19 Potential to Achieve Goals: Good ADL Goals Pt Will Perform Grooming: with modified independence;sitting;standing Pt Will Perform Upper Body Dressing: with modified  independence;sitting Pt Will Perform Lower Body Dressing: with modified independence;sit to/from stand;sitting/lateral leans Pt Will Transfer to Toilet: with modified independence;ambulating;bedside commode Pt Will Perform Toileting - Clothing Manipulation and hygiene: with modified independence;sitting/lateral leans;sit to/from stand  Plan Discharge plan remains appropriate    Co-evaluation                 AM-PAC OT "6 Clicks" Daily Activity     Outcome Measure   Help from another person eating meals?: None Help from another person taking care of personal grooming?: A Little Help from another person toileting, which includes using toliet, bedpan, or urinal?: A Little Help from another person bathing (including washing, rinsing, drying)?: A Little Help from another person to put on and taking off regular upper body clothing?: A Little Help from another person to put on and taking off regular lower body clothing?: A Little 6 Click Score: 19    End of Session Equipment Utilized During Treatment: Rolling walker;Gait belt  OT Visit Diagnosis: Unsteadiness on feet (R26.81);Other abnormalities of gait and mobility (R26.89);History of falling (Z91.81);Pain Pain - Right/Left: Left Pain - part of body: Hip   Activity Tolerance Patient tolerated treatment well   Patient Left in chair;with call bell/phone within reach;with chair alarm set   Nurse Communication          Time: 7185-5015 OT Time Calculation (min): 34 min  Charges: OT General Charges $OT Visit: 1 Visit OT Treatments $Self Care/Home Management : 23-37 mins  Delorean Knutzen H. OTR/L Supplemental OT, Department of rehab services [phone]   Romond Pipkins R H. 09/28/2019, 3:26 PM

## 2019-09-28 NOTE — Plan of Care (Signed)
  Problem: Activity: Goal: Ability to avoid complications of mobility impairment will improve Outcome: Progressing Goal: Ability to tolerate increased activity will improve Outcome: Progressing   Problem: Education: Goal: Verbalization of understanding the information provided will improve Outcome: Progressing   Problem: Coping: Goal: Level of anxiety will decrease Outcome: Progressing   Problem: Physical Regulation: Goal: Postoperative complications will be avoided or minimized Outcome: Progressing   Problem: Respiratory: Goal: Ability to maintain a clear airway will improve Outcome: Progressing   Problem: Pain Management: Goal: Pain level will decrease Outcome: Progressing   Problem: Skin Integrity: Goal: Signs of wound healing will improve Outcome: Progressing   Problem: Tissue Perfusion: Goal: Ability to maintain adequate tissue perfusion will improve Outcome: Progressing   Problem: Education: Goal: Knowledge of General Education information will improve Description: Including pain rating scale, medication(s)/side effects and non-pharmacologic comfort measures Outcome: Progressing   Problem: Health Behavior/Discharge Planning: Goal: Ability to manage health-related needs will improve Outcome: Progressing   Problem: Clinical Measurements: Goal: Ability to maintain clinical measurements within normal limits will improve Outcome: Progressing Goal: Will remain free from infection Outcome: Progressing Goal: Diagnostic test results will improve Outcome: Progressing Goal: Respiratory complications will improve Outcome: Progressing Goal: Cardiovascular complication will be avoided Outcome: Progressing   Problem: Activity: Goal: Risk for activity intolerance will decrease Outcome: Progressing   Problem: Nutrition: Goal: Adequate nutrition will be maintained Outcome: Progressing   Problem: Coping: Goal: Level of anxiety will decrease Outcome: Progressing    Problem: Elimination: Goal: Will not experience complications related to bowel motility Outcome: Progressing Goal: Will not experience complications related to urinary retention Outcome: Progressing   Problem: Pain Managment: Goal: General experience of comfort will improve Outcome: Progressing   Problem: Safety: Goal: Ability to remain free from injury will improve Outcome: Progressing   Problem: Skin Integrity: Goal: Risk for impaired skin integrity will decrease Outcome: Progressing   

## 2019-09-28 NOTE — Progress Notes (Signed)
PROGRESS NOTE  Diana Potts  DOB: [birth year]  PCP: Patient, No Pcp Per ZJI:[id number]  DOA: 09/23/2019  LOS: 5 days   Chief Complaint  Patient presents with  . Hip Pain    left    Brief narrative: 79 year old female with history of COPD, chronic and stable, osteoporosis and anxiety.  Patient presented with mechanical fall and left femoral fracture.  She was admitted to hospital with acute traumatic femoral fracture Underwent ORIF on 7/17.  Subjective: Patient was seen and examined this morning.  Lying down in bed.  She is having multiple loose bowel movements since last night. Systolic blood pressure with 100 and 120. Labs from this morning with hemoglobin low at 8.4.  Assessment/Plan: Left hip fracture: -7/17, underwent ORIF. -Continue pain control with Tylenol, Norco. -Aspirin 325 mg twice daily for DVT prophylaxis per orthopedics. Continue Protonix while on aspirin twice daily. -PT/OT evaluation obtained.  SNF recommended.  Pending placement.  Orthostatic hypotension -Patient has had episodes of dizziness and low blood pressure on getting up.   -Patient was adequately hydrated with normal saline.  Okay to stop IV fluids today.   Diarrhea -After using MiraLAX yesterday, patient had multiple episodes of loose motion. -No fever, WBC count normal.  Stop stool softeners.  Okay to try 1 dose of Imodium.  Anticipated postoperative drop in hemoglobin -Her hemoglobin was 10.6 on admission on 7/16. -Postoperatively remains down.  0.4 today.  Slightly better than 8.2 yesterday. -No active bleeding. Continue to monitor hemoglobin.  COPD:  -Chronic and stable. On Singulair, steroid inhalers and albuterol as needed. -Not on supplemental oxygen.  Anxiety:  -Chronic and stable.   -Takes Prozac.   -Occasionally uses Ativan as needed that she can use.  Mobility: PT OT evaluation Code Status:   Code Status: Full Code  Nutritional status: Body mass index is 18.56 kg/m.      Diet Order            Diet regular Room service appropriate? Yes; Fluid consistency: Thin  Diet effective now                 DVT prophylaxis: Aspirin 325 mg twice daily for DVT prophylaxis per orthopedics.   SCDs Start: 09/26/19 0802 SCDs Start: 09/23/19 2330   Antimicrobials:  None Fluid: None  Consultants: Orthopedics Family Communication:  None at bedside  Status is: Inpatient  Remains inpatient appropriate because: Diarrhea, continued weakness  Dispo:  Patient From: Home  Planned Disposition: Skilled Nursing Facility  Expected discharge date: 09/29/2019  Medically stable for discharge: No.    Infusions:    Scheduled Meds: . aspirin  325 mg Oral BID  . cholecalciferol  2,000 Units Oral Daily  . dorzolamide-timolol  1 drop Both Eyes BID  . FLUoxetine  10 mg Oral QHS  . HYDROcodone-acetaminophen  1 tablet Oral Q6H  . latanoprost  1 drop Both Eyes QHS  . mometasone-formoterol  2 puff Inhalation BID  . montelukast  10 mg Oral QHS  . ondansetron (ZOFRAN) IV  4 mg Intravenous Once  . pantoprazole  40 mg Oral Daily    Antimicrobials: Anti-infectives (From admission, onward)   Start     Dose/Rate Route Frequency Ordered Stop   09/26/19 0830  ceFAZolin (ANCEF) IVPB 2g/100 mL premix        2 g 200 mL/hr over 30 Minutes Intravenous Every 6 hours 09/26/19 0801 09/26/19 1545   09/24/19 0800  ceFAZolin (ANCEF) IVPB 2g/100 mL premix  2 g 200 mL/hr over 30 Minutes Intravenous  Once 09/24/19 0752 09/24/19 1008   09/24/19 0753  ceFAZolin (ANCEF) 2-4 GM/100ML-% IVPB       Note to Pharmacy: Shireen Quan   : cabinet override      09/24/19 0753 09/24/19 0938      PRN meds: albuterol, bisacodyl, HYDROcodone-acetaminophen, ibuprofen, loperamide, LORazepam, menthol-cetylpyridinium, metoCLOPramide **OR** metoCLOPramide (REGLAN) injection, morphine injection, ondansetron **OR** ondansetron (ZOFRAN) IV, polyethylene glycol, sodium phosphate   Objective: Vitals:    09/28/19 0320 09/28/19 0743  BP: (!) 107/47 (!) 112/58  Pulse: 91 89  Resp: 19 16  Temp: 98.2 F (36.8 C) (!) 97.3 F (36.3 C)  SpO2: 97% 98%   No intake or output data in the 24 hours ending 09/28/19 1036 Filed Weights   09/23/19 2007  Weight: 52.2 kg   Weight change:  Body mass index is 18.56 kg/m.   Physical Exam: General exam: Appears calm and comfortable.  In distress because of diarrhea Skin: No rashes, lesions or ulcers. HEENT: Atraumatic, normocephalic, supple neck, no obvious bleeding Lungs: Clear to auscultation bilaterally CVS: Regular rate and rhythm, no murmur GI/Abd soft, nontender, nondistended, bowel sound present CNS: Alert, awake, oriented x3. Psychiatry: Mood appropriate Extremities: No pedal edema, no calf tenderness  Data Review: I have personally reviewed the laboratory data and studies available.  Recent Labs  Lab 09/23/19 2141 09/25/19 0238 09/27/19 0857 09/28/19 0324  WBC 11.0* 7.2 10.7* 10.3  NEUTROABS 8.9* 4.7 9.3* 8.3*  HGB 10.6* 8.9* 8.2* 8.4*  HCT 34.6* 28.3* 26.3* 26.8*  MCV 100.3* 99.0 98.9 100.0  PLT 330 228 237 294   Recent Labs  Lab 09/23/19 2141 09/25/19 0238 09/27/19 0857  NA 143 141 140  K 3.5 3.3* 3.9  CL 107 110 108  CO2 25 26 25   GLUCOSE 116* 115* 109*  BUN 10 9 8   CREATININE 0.69 0.65 0.52  CALCIUM 9.0 8.2* 8.2*  MG  --  2.1 2.0  PHOS  --  3.6 3.0    Signed, , MD [facility] Pager: [phone] (Secure Chat preferred). 09/28/2019

## 2019-09-29 LAB — CBC WITH DIFFERENTIAL/PLATELET
Abs Immature Granulocytes: 0.03 10*3/uL (ref 0.00–0.07)
Basophils Absolute: 0.1 10*3/uL (ref 0.0–0.1)
Basophils Relative: 1 %
Eosinophils Absolute: 0.5 10*3/uL (ref 0.0–0.5)
Eosinophils Relative: 6 %
HCT: 25.8 % — ABNORMAL LOW (ref 36.0–46.0)
Hemoglobin: 8.2 g/dL — ABNORMAL LOW (ref 12.0–15.0)
Immature Granulocytes: 0 %
Lymphocytes Relative: 14 %
Lymphs Abs: 1.2 10*3/uL (ref 0.7–4.0)
MCH: 31.8 pg (ref 26.0–34.0)
MCHC: 31.8 g/dL (ref 30.0–36.0)
MCV: 100 fL (ref 80.0–100.0)
Monocytes Absolute: 0.4 10*3/uL (ref 0.1–1.0)
Monocytes Relative: 5 %
Neutro Abs: 6.8 10*3/uL (ref 1.7–7.7)
Neutrophils Relative %: 74 %
Platelets: 315 10*3/uL (ref 150–400)
RBC: 2.58 MIL/uL — ABNORMAL LOW (ref 3.87–5.11)
RDW: 13.8 % (ref 11.5–15.5)
WBC: 9 10*3/uL (ref 4.0–10.5)
nRBC: 0 % (ref 0.0–0.2)

## 2019-09-29 MED ORDER — SENNA 8.6 MG PO TABS
1.0000 | ORAL_TABLET | Freq: Every day | ORAL | Status: DC
  Administered 2019-09-30: 8.6 mg via ORAL
  Filled 2019-09-29: qty 1

## 2019-09-29 NOTE — Progress Notes (Signed)
PROGRESS NOTE  Diana Potts  DOB: [birth year]  PCP: Patient, No Pcp Per HBZ:[id number]  DOA: 09/23/2019  LOS: 6 days   Chief Complaint  Patient presents with  . Hip Pain    left    Brief narrative: 79 year old female with history of COPD, chronic and stable, osteoporosis and anxiety.  Patient presented with mechanical fall and left femoral fracture.  She was admitted to hospital with acute traumatic femoral fracture Underwent ORIF on 7/17.  Subjective: Patient was seen and examined this afternoon.  Lying on bed.  Not in distress.  Diarrhea has improved.  Feels better. Labs from this morning with hemoglobin low at 8.2.  Assessment/Plan: Left hip fracture: -7/17, underwent ORIF. -Continue pain control with Tylenol, Norco. -Aspirin 325 mg twice daily for DVT prophylaxis per orthopedics. Continue Protonix while on aspirin twice daily. -PT/OT evaluation obtained.  SNF recommended.  Pending placement.  Orthostatic hypotension -Postsurgically, patient had episodes of dizziness and low blood pressure on getting up.   -Patient was adequately hydrated with normal saline.  Fluid is stopped.  Blood pressure stable.  No more episode of dizziness.  Diarrhea -After using MiraLAX yesterday, patient had multiple episodes of loose motion. -No fever, WBC count normal.  -Diarrhea stopped today.  Anticipated postoperative drop in hemoglobin -Her hemoglobin was 10.6 on admission on 7/16. -Postoperatively hemoglobin has been trending low, 8.2 today. -No active bleeding. Continue to monitor hemoglobin.  COPD:  -Chronic and stable. On Singulair, steroid inhalers and albuterol as needed. -Not on supplemental oxygen.  Anxiety:  -Chronic and stable.   -Takes Prozac.   -Occasionally uses Ativan as needed that she can use.  Mobility: PT OT evaluation Code Status:   Code Status: Full Code  Nutritional status: Body mass index is 18.56 kg/m.     Diet Order            Diet regular Room  service appropriate? Yes; Fluid consistency: Thin  Diet effective now                 DVT prophylaxis: Aspirin 325 mg twice daily for DVT prophylaxis per orthopedics.   SCDs Start: 09/26/19 0802 SCDs Start: 09/23/19 2330   Antimicrobials:  None Fluid: None  Consultants: Orthopedics Family Communication:  None at bedside  Status is: Inpatient Remains inpatient appropriate because: Diarrhea, continued weakness  Dispo:  Patient From: Home  Planned Disposition: Skilled Nursing Facility  Expected discharge date: Whenever bed is available  Medically stable for discharge: Yes   Infusions:    Scheduled Meds: . aspirin  325 mg Oral BID  . cholecalciferol  2,000 Units Oral Daily  . dorzolamide-timolol  1 drop Both Eyes BID  . FLUoxetine  10 mg Oral QHS  . HYDROcodone-acetaminophen  1 tablet Oral Q6H  . latanoprost  1 drop Both Eyes QHS  . mometasone-formoterol  2 puff Inhalation BID  . montelukast  10 mg Oral QHS  . ondansetron (ZOFRAN) IV  4 mg Intravenous Once  . pantoprazole  40 mg Oral Daily  . senna  1 tablet Oral QHS    Antimicrobials: Anti-infectives (From admission, onward)   Start     Dose/Rate Route Frequency Ordered Stop   09/26/19 0830  ceFAZolin (ANCEF) IVPB 2g/100 mL premix        2 g 200 mL/hr over 30 Minutes Intravenous Every 6 hours 09/26/19 0801 09/26/19 1545   09/24/19 0800  ceFAZolin (ANCEF) IVPB 2g/100 mL premix        2 g 200 mL/hr  over 30 Minutes Intravenous  Once 09/24/19 0752 09/24/19 1008   09/24/19 0753  ceFAZolin (ANCEF) 2-4 GM/100ML-% IVPB       Note to Pharmacy: Shireen Quan   : cabinet override      09/24/19 0753 09/24/19 0938      PRN meds: albuterol, bisacodyl, HYDROcodone-acetaminophen, ibuprofen, loperamide, LORazepam, menthol-cetylpyridinium, metoCLOPramide **OR** metoCLOPramide (REGLAN) injection, morphine injection, ondansetron **OR** ondansetron (ZOFRAN) IV, polyethylene glycol, sodium phosphate   Objective: Vitals:    09/29/19 0305 09/29/19 0821  BP: (!) 112/50 (!) 113/56  Pulse: 75 77  Resp: 14 18  Temp: 97.9 F (36.6 C) 98.4 F (36.9 C)  SpO2: 97% 99%    Intake/Output Summary (Last 24 hours) at 09/29/2019 1337 Last data filed at 09/29/2019 0600 Gross per 24 hour  Intake 480 ml  Output --  Net 480 ml   Filed Weights   09/23/19 2007  Weight: 52.2 kg   Weight change:  Body mass index is 18.56 kg/m.   Physical Exam: General exam: Appears calm and comfortable.  Not in physical distress Skin: No rashes, lesions or ulcers. HEENT: Atraumatic, normocephalic, supple neck, no obvious bleeding Lungs: Clear to auscultation bilaterally CVS: Regular rate and rhythm, no murmur GI/Abd soft, nontender, nondistended, bowel sound present CNS: Alert, awake, oriented x3. Psychiatry: Mood appropriate Extremities: No pedal edema, no calf tenderness  Data Review: I have personally reviewed the laboratory data and studies available.  Recent Labs  Lab 09/23/19 2141 09/25/19 0238 09/27/19 0857 09/28/19 0324 09/29/19 0202  WBC 11.0* 7.2 10.7* 10.3 9.0  NEUTROABS 8.9* 4.7 9.3* 8.3* 6.8  HGB 10.6* 8.9* 8.2* 8.4* 8.2*  HCT 34.6* 28.3* 26.3* 26.8* 25.8*  MCV 100.3* 99.0 98.9 100.0 100.0  PLT 330 228 237 294 315   Recent Labs  Lab 09/23/19 2141 09/25/19 0238 09/27/19 0857  NA 143 141 140  K 3.5 3.3* 3.9  CL 107 110 108  CO2 25 26 25   GLUCOSE 116* 115* 109*  BUN 10 9 8   CREATININE 0.69 0.65 0.52  CALCIUM 9.0 8.2* 8.2*  MG  --  2.1 2.0  PHOS  --  3.6 3.0    Signed, , MD [facility] Pager: [phone] (Secure Chat preferred). 09/29/2019

## 2019-09-29 NOTE — Plan of Care (Signed)
  Problem: Pain Managment: Goal: General experience of comfort will improve Outcome: Progressing   Problem: Safety: Goal: Ability to remain free from injury will improve Outcome: Progressing   

## 2019-09-30 DIAGNOSIS — I951 Orthostatic hypotension: Secondary | ICD-10-CM

## 2019-09-30 LAB — CBC WITH DIFFERENTIAL/PLATELET
Abs Immature Granulocytes: 0.02 10*3/uL (ref 0.00–0.07)
Basophils Absolute: 0.1 10*3/uL (ref 0.0–0.1)
Basophils Relative: 1 %
Eosinophils Absolute: 0.4 10*3/uL (ref 0.0–0.5)
Eosinophils Relative: 6 %
HCT: 24 % — ABNORMAL LOW (ref 36.0–46.0)
Hemoglobin: 7.5 g/dL — ABNORMAL LOW (ref 12.0–15.0)
Immature Granulocytes: 0 %
Lymphocytes Relative: 15 %
Lymphs Abs: 1 10*3/uL (ref 0.7–4.0)
MCH: 31.3 pg (ref 26.0–34.0)
MCHC: 31.3 g/dL (ref 30.0–36.0)
MCV: 100 fL (ref 80.0–100.0)
Monocytes Absolute: 0.4 10*3/uL (ref 0.1–1.0)
Monocytes Relative: 6 %
Neutro Abs: 4.8 10*3/uL (ref 1.7–7.7)
Neutrophils Relative %: 72 %
Platelets: 331 10*3/uL (ref 150–400)
RBC: 2.4 MIL/uL — ABNORMAL LOW (ref 3.87–5.11)
RDW: 13.8 % (ref 11.5–15.5)
WBC: 6.7 10*3/uL (ref 4.0–10.5)
nRBC: 0 % (ref 0.0–0.2)

## 2019-09-30 LAB — HEMOGLOBIN AND HEMATOCRIT, BLOOD
HCT: 25.5 % — ABNORMAL LOW (ref 36.0–46.0)
Hemoglobin: 7.9 g/dL — ABNORMAL LOW (ref 12.0–15.0)

## 2019-09-30 MED ORDER — PANTOPRAZOLE SODIUM 40 MG PO TBEC
40.0000 mg | DELAYED_RELEASE_TABLET | Freq: Two times a day (BID) | ORAL | Status: DC
  Administered 2019-09-30 – 2019-10-01 (×2): 40 mg via ORAL
  Filled 2019-09-30 (×2): qty 1

## 2019-09-30 MED ORDER — HYDROCODONE-ACETAMINOPHEN 5-325 MG PO TABS
1.0000 | ORAL_TABLET | ORAL | Status: DC | PRN
  Administered 2019-09-30 – 2019-10-01 (×5): 1 via ORAL
  Filled 2019-09-30 (×5): qty 1

## 2019-09-30 NOTE — Progress Notes (Signed)
Physical Therapy Treatment Patient Details Name: Diana Potts MRN: [id number] DOB: Aug 23, 1940 Today's Date: 09/30/2019    History of Present Illness Patient is a 79 year old female medical history significant for hx of anal cancer, COPD, osteoporosis, anxiety who presents following a mechanical fall sustaining L hip fracture. Patient s/p L IM nail.     PT Comments    Pt reports fatigue and pain, but agreeable to OOB. Pt ambulated hallway distance with min guard to supervision level of assist, verbal cuing for form and safety throughout. Pt tolerated LE strengthening exercises well, limited by LLE weakness. PT continuing to recommend SNF level of care to maximize pt functional recovery.    Follow Up Recommendations  SNF;Supervision/Assistance - 24 hour     Equipment Recommendations  Rolling walker with 5" wheels;3in1 (PT)    Recommendations for Other Services       Precautions / Restrictions Precautions Precautions: Fall Restrictions Weight Bearing Restrictions: No LLE Weight Bearing: Weight bearing as tolerated    Mobility  Bed Mobility Overal bed mobility: Needs Assistance             General bed mobility comments: pt up in recliner upon PT arrival to room  Transfers Overall transfer level: Needs assistance Equipment used: Rolling walker (2 wheeled) Transfers: Sit to/from Stand   Stand pivot transfers: Min assist       General transfer comment: min assist to steady upon standing, correct hand placement when rising and sitting  Ambulation/Gait Ambulation/Gait assistance: Min guard;Supervision Gait Distance (Feet): 70 Feet Assistive device: Rolling walker (2 wheeled) Gait Pattern/deviations: Step-to pattern;Trunk flexed;Antalgic;Decreased stride length;Decreased stance time - left;Decreased weight shift to left;Step-through pattern;Narrow base of support Gait velocity: decr   General Gait Details: min guard for safety, transitioning to supervision with  further ambulation. Verbal cuing for widening BOS, upright posture. No s/s dizziness   Stairs             Wheelchair Mobility    Modified Rankin (Stroke Patients Only)       Balance Overall balance assessment: Needs assistance   Sitting balance-Leahy Scale: Good     Standing balance support: Bilateral upper extremity supported;During functional activity Standing balance-Leahy Scale: Poor Standing balance comment: reliant on external support in standing                            Cognition Arousal/Alertness: Awake/alert Behavior During Therapy: WFL for tasks assessed/performed Overall Cognitive Status: Within Functional Limits for tasks assessed                                        Exercises General Exercises - Lower Extremity Ankle Circles/Pumps: AROM;Both;15 reps;Seated Long Arc Quad: AROM;Both;Seated;10 reps Hip ABduction/ADduction: AROM;Right;AAROM;Left;10 reps;Seated (with LEs elevated on recliner, use of gait belt loop to assist on LLE) Hip Flexion/Marching: AROM;Right;AAROM;Left;10 reps;Seated    General Comments        Pertinent Vitals/Pain Pain Assessment: 0-10 Pain Score: 4  Pain Location: L hip Pain Descriptors / Indicators: Sore Pain Intervention(s): Monitored during session;Repositioned;Limited activity within patient's tolerance;Patient requesting pain meds-RN notified    Home Living                      Prior Function            PT Goals (current goals can now be found in  the care plan section) Acute Rehab PT Goals Patient Stated Goal: return to doing yoga PT Goal Formulation: With patient Time For Goal Achievement: 10/09/19 Potential to Achieve Goals: Good Progress towards PT goals: Progressing toward goals    Frequency    Min 3X/week      PT Plan Current plan remains appropriate    Co-evaluation              AM-PAC PT "6 Clicks" Mobility   Outcome Measure  Help needed turning  from your back to your side while in a flat bed without using bedrails?: None Help needed moving from lying on your back to sitting on the side of a flat bed without using bedrails?: A Little Help needed moving to and from a bed to a chair (including a wheelchair)?: A Little Help needed standing up from a chair using your arms (e.g., wheelchair or bedside chair)?: A Little Help needed to walk in hospital room?: A Little Help needed climbing 3-5 steps with a railing? : A Lot 6 Click Score: 18    End of Session Equipment Utilized During Treatment: Gait belt Activity Tolerance: Patient tolerated treatment well Patient left: with call bell/phone within reach;in chair Nurse Communication: Mobility status PT Visit Diagnosis: Pain;Difficulty in walking, not elsewhere classified (R26.2) Pain - Right/Left: Left Pain - part of body: Leg     Time: 1937-9024 PT Time Calculation (min) (ACUTE ONLY): 24 min  Charges:  $Gait Training: 8-22 mins $Therapeutic Exercise: 8-22 mins                    Braylin Xu E, PT Acute Rehabilitation Services Pager [phone]  Office [phone]    Deondray Ospina D Despina Hidden 09/30/2019, 4:36 PM

## 2019-09-30 NOTE — Hospital Course (Addendum)
Diana Potts is a 79 year old female with history of COPD, chronic and stable, osteoporosis and anxiety.  Patient presented with mechanical fall and found to have a left femoral fracture.  Underwent ORIF on 7/17. She has been recovering well and working with PT however has large amount of bruising in her left hip and Hgb has been downtrending. This stabilized around ~7.5 g/dL and will need further intermittent monitoring. She has also been on NSAIDs at home prior to admission and now that she is on ASA 325 mg BID for DVT ppx s/p surgery she would continue to be a GIB risk and will need intermittent Hgb monitoring. She was informed to continue monitoring her stools for any changes and voiced understanding.   She will follow up with ortho at discharge as well.

## 2019-09-30 NOTE — Assessment & Plan Note (Addendum)
Chronic and stable.  -Takes Prozac.  -Occasionally uses Ativan

## 2019-09-30 NOTE — Progress Notes (Signed)
Occupational Therapy Treatment Patient Details Name: Diana Potts MRN: [id number] DOB: [birth year] Today's Date: 09/30/2019    History of present illness Patient is a 79 year old female medical history significant for hx of anal cancer, COPD, osteoporosis, anxiety who presents following a mechanical fall sustaining L hip fracture. Patient s/p L IM nail.    OT comments  Pt completed seated grooming with set up at EOB, transferred to 3 in 1 with min assist and return to supine. Pt reports feeling weak, hgb has been trending down and currently at 7.5. Pt is agreeable to SNF as her son is not helpful.   Follow Up Recommendations  SNF;Supervision/Assistance - 24 hour    Equipment Recommendations  3 in 1 bedside commode    Recommendations for Other Services      Precautions / Restrictions Precautions Precautions: Fall Restrictions LLE Weight Bearing: Weight bearing as tolerated       Mobility Bed Mobility Overal bed mobility: Needs Assistance Bed Mobility: Supine to Sit;Sit to Supine     Supine to sit: Modified independent (Device/Increase time) Sit to supine: Min assist   General bed mobility comments: assists L LE with her hands, min assist for L LE back into bed  Transfers Overall transfer level: Needs assistance Equipment used: Rolling walker (2 wheeled) Transfers: Sit to/from [employer] Sit to Stand: Min assist Stand pivot transfers: Min assist       General transfer comment: assist to rise and for stability    Balance Overall balance assessment: Needs assistance   Sitting balance-Leahy Scale: Good     Standing balance support: Bilateral upper extremity supported;During functional activity Standing balance-Leahy Scale: Poor Standing balance comment: reliant on at least one hand support of walker                           ADL either performed or assessed with clinical judgement   ADL Overall ADL's : Needs assistance/impaired      Grooming: Set up;Sitting;Wash/dry hands;Wash/dry face;Oral care               Lower Body Dressing: Minimal assistance;Bed level   Toilet Transfer: Minimal assistance;Stand-pivot;RW;BSC   Toileting- Clothing Manipulation and Hygiene: Minimal assistance;Sit to/from stand               Vision       Perception     Praxis      Cognition Arousal/Alertness: Awake/alert Behavior During Therapy: WFL for tasks assessed/performed Overall Cognitive Status: Within Functional Limits for tasks assessed                                          Exercises     Shoulder Instructions       General Comments      Pertinent Vitals/ Pain       Pain Assessment: Faces Faces Pain Scale: Hurts little more Pain Location: L hip Pain Descriptors / Indicators: Sore Pain Intervention(s): Monitored during session;Premedicated before session;Repositioned  Home Living                                          Prior Functioning/Environment              Frequency  Min 2X/week  Progress Toward Goals  OT Goals(current goals can now be found in the care plan section)  Progress towards OT goals: Progressing toward goals  Acute Rehab OT Goals Patient Stated Goal: To return home.  OT Goal Formulation: With patient Time For Goal Achievement: 10/09/19 Potential to Achieve Goals: Good  Plan Discharge plan remains appropriate    Co-evaluation                 AM-PAC OT "6 Clicks" Daily Activity     Outcome Measure   Help from another person eating meals?: None Help from another person taking care of personal grooming?: A Little Help from another person toileting, which includes using toliet, bedpan, or urinal?: A Little Help from another person bathing (including washing, rinsing, drying)?: A Little Help from another person to put on and taking off regular upper body clothing?: A Little Help from another person to put on and  taking off regular lower body clothing?: A Little 6 Click Score: 19    End of Session Equipment Utilized During Treatment: Rolling walker;Gait belt  OT Visit Diagnosis: Unsteadiness on feet (R26.81);Other abnormalities of gait and mobility (R26.89);History of falling (Z91.81);Pain Pain - Right/Left: Left Pain - part of body: Hip   Activity Tolerance Patient tolerated treatment well   Patient Left in bed;with call bell/phone within reach   Nurse Communication          Time: 7829-5621 OT Time Calculation (min): 22 min  Charges: OT General Charges $OT Visit: 1 Visit OT Treatments $Self Care/Home Management : 8-22 mins  Martie Round, OTR/L Acute Rehabilitation Services Pager: [phone] Office: [phone]   Evern Bio 09/30/2019, 12:01 PM

## 2019-09-30 NOTE — Assessment & Plan Note (Addendum)
-  7/17, underwent Left hip ORIF -Continue pain control with Tylenol, Norco. -Aspirin 325 mg twice daily for DVT prophylaxis per orthopedics. Continue Protonix while on aspirin twice daily. - Hgb continues to downtrend and she has large amount of bruising in left hip; no other concern for active bleed elsewhere at this time; does have some indigestion and hx hiatal hernia; on PPI as noted; there is also small risk of underlying GI bleed given home use of BC powder and other NSAIDs prior to admission and she will need intermittent monitoring of hemoglobin - d/c ibuprofen -PT/OT evaluation obtained.  SNF recommended.

## 2019-09-30 NOTE — Progress Notes (Signed)
PROGRESS NOTE    Diana Potts   MHD:[id number]  DOB: May 05, 1940  DOA: 09/23/2019     7  PCP: Patient, No Pcp Per  CC: fall at home  Hospital Course: 79 year old female with history of COPD, chronic and stable, osteoporosis and anxiety.  Patient presented with mechanical fall and found to have left femoral fracture.  Underwent ORIF on 7/17. She has been recovering well and working with PT however has large amount of bruising in her left hip and Hgb has been downtrending.    Interval History:  No events overnight. States was dizzy some yesterday but feeling better today. Informed her about Hgb still downtrending and may need transfusion if falls further. Left hip swollen and bruised but no larger than it was a couple days ago she says. Pain also controlled and she is ambulating well. Denies any other obvious blood or bleeding episodes.   Old records reviewed in assessment of this patient  ROS: Constitutional: negative for chills, fatigue and fevers, Respiratory: negative for cough and wheezing, Cardiovascular: negative for chest pain and Gastrointestinal: negative for abdominal pain  Assessment & Plan: Macrocytic anemia Hemoglobin 10.6 on admission on 7/16. Now down to 7.5 g/dL on 2/11; large bruising over L hip - see hip fx - follow up afternoon Hgb and will transfuse if needed for Hgb <7 g/dL  Hip fracture (HCC) -9/41, underwent Left hip ORIF -Continue pain control with Tylenol, Norco. -Aspirin 325 mg twice daily for DVT prophylaxis per orthopedics. Continue Protonix while on aspirin twice daily. - Hgb continues to downtrend and she has large amount of bruising in left hip; no other concern for active bleed elsewhere at this time; does have some indigestion and hx hiatal hernia; on PPI as noted - trend Hgb further; if requires transfusion, will need to likely scan hip to make sure no other active bleed/area of concern - d/c ibuprofen -PT/OT evaluation obtained.  SNF  recommended.  Pending placement  Orthostatic hypotension Postsurgically, patient had episodes of dizziness and low blood pressure on getting up.   -Patient was adequately hydrated with normal saline.  Fluid is stopped.  Blood pressure stable.  No more episode of dizziness.  COPD (chronic obstructive pulmonary disease) (HCC) Chronic and stable. On Singulair, steroid inhalers and albuterol as needed. -Not on supplemental oxygen.  Anxiety Chronic and stable.  -Takes Prozac.  -Occasionally uses Ativan   Antimicrobials: n/a  DVT prophylaxis: ASA BID Code Status: Full Family Communication: none present Disposition Plan:  Status is: Inpatient  Remains inpatient appropriate because:Unsafe d/c plan and Inpatient level of care appropriate due to severity of illness   Dispo:  Patient From: Home  Planned Disposition: Skilled Nursing Facility  Expected discharge date: 09/26/19  Medically stable for discharge: No        Objective: Blood pressure (!) 114/60, pulse 68, temperature 98.2 F (36.8 C), temperature source Oral, resp. rate 16, height 5\' 6"  (1.676 m), weight 52.2 kg, SpO2 97 %.  Examination: General appearance: alert, cooperative and no distress Head: Normocephalic, without obvious abnormality, atraumatic Eyes: EOMI Lungs: clear to auscultation bilaterally Heart: regular rate and rhythm and S1, S2 normal Abdomen: normal findings: bowel sounds normal and soft, non-tender Extremities: left hip noted with large bruising around surgical site and left thigh is swollen and larger than right; compartment is soft and minimally tender; bruising extends to posterior thigh and slightly up towards back Skin: left thigh and hip bruising noted Neurologic: Grossly normal   Consultants:   Ortho  Procedures:   09/24/19: Left IM nail, hip  Data Reviewed: I have personally reviewed following labs and imaging studies Results for orders placed or performed during the hospital  encounter of 09/23/19 (from the past 24 hour(s))  CBC with Differential/Platelet     Status: Abnormal   Collection Time: 09/30/19  4:06 AM  Result Value Ref Range   WBC 6.7 4.0 - 10.5 K/uL   RBC 2.40 (L) 3.87 - 5.11 MIL/uL   Hemoglobin 7.5 (L) 12.0 - 15.0 g/dL   HCT 32.2 (L) 36 - 46 %   MCV 100.0 80.0 - 100.0 fL   MCH 31.3 26.0 - 34.0 pg   MCHC 31.3 30.0 - 36.0 g/dL   RDW 02.5 42.7 - 06.2 %   Platelets 331 150 - 400 K/uL   nRBC 0.0 0.0 - 0.2 %   Neutrophils Relative % 72 %   Neutro Abs 4.8 1.7 - 7.7 K/uL   Lymphocytes Relative 15 %   Lymphs Abs 1.0 0.7 - 4.0 K/uL   Monocytes Relative 6 %   Monocytes Absolute 0.4 0 - 1 K/uL   Eosinophils Relative 6 %   Eosinophils Absolute 0.4 0 - 0 K/uL   Basophils Relative 1 %   Basophils Absolute 0.1 0 - 0 K/uL   Immature Granulocytes 0 %   Abs Immature Granulocytes 0.02 0.00 - 0.07 K/uL    Recent Results (from the past 240 hour(s))  SARS Coronavirus 2 by RT PCR (hospital order, performed in [facility] hospital lab) Nasopharyngeal Nasopharyngeal Swab     Status: None   Collection Time: 09/23/19  9:41 PM   Specimen: Nasopharyngeal Swab  Result Value Ref Range Status   SARS Coronavirus 2 NEGATIVE NEGATIVE Final    Comment: (NOTE) SARS-CoV-2 target nucleic acids are NOT DETECTED.  The SARS-CoV-2 RNA is generally detectable in upper and lower respiratory specimens during the acute phase of infection. The lowest concentration of SARS-CoV-2 viral copies this assay can detect is 250 copies / mL. A negative result does not preclude SARS-CoV-2 infection and should not be used as the sole basis for treatment or other patient management decisions.  A negative result may occur with improper specimen collection / handling, submission of specimen other than nasopharyngeal swab, presence of viral mutation(s) within the areas targeted by this assay, and inadequate number of viral copies (<250 copies / mL). A negative result must be combined with  clinical observations, patient history, and epidemiological information.  Fact Sheet for Patients:   BoilerBrush.com.cy  Fact Sheet for Healthcare Providers: https://pope.com/  This test is not yet approved or  cleared by the [place] FDA and has been authorized for detection and/or diagnosis of SARS-CoV-2 by FDA under an Emergency Use Authorization (EUA).  This EUA will remain in effect (meaning this test can be used) for the duration of the COVID-19 declaration under Section 564(b)(1) of the Act, 21 U.S.C. section 360bbb-3(b)(1), unless the authorization is terminated or revoked sooner.  Performed at [facility], [address] [place], [place] [address]   MRSA PCR Screening     Status: None   Collection Time: 09/24/19  1:51 AM   Specimen: Nasal Mucosa; Nasopharyngeal  Result Value Ref Range Status   MRSA by PCR NEGATIVE NEGATIVE Final    Comment:        The GeneXpert MRSA Assay (FDA approved for NASAL specimens only), is one component of a comprehensive MRSA colonization surveillance program. It is not intended to diagnose MRSA  infection nor to guide or monitor treatment for MRSA infections. Performed at [facility], [address], [place], [place] [address]      Radiology Studies: No results found. DG C-Arm 1-60 Min  Final Result    DG HIP UNILAT WITH PELVIS 2-3 VIEWS LEFT  Final Result    DG Hip Unilat W or Wo Pelvis 2-3 Views Left  Final Result       Scheduled Meds: . aspirin  325 mg Oral BID  . cholecalciferol  2,000 Units Oral Daily  . dorzolamide-timolol  1 drop Both Eyes BID  . FLUoxetine  10 mg Oral QHS  . latanoprost  1 drop Both Eyes QHS  . mometasone-formoterol  2 puff Inhalation BID  . montelukast  10 mg Oral QHS  . ondansetron (ZOFRAN) IV  4 mg Intravenous Once  . pantoprazole  40 mg Oral BID  . senna  1 tablet Oral QHS   PRN Meds: albuterol, bisacodyl,  HYDROcodone-acetaminophen, loperamide, LORazepam, menthol-cetylpyridinium, metoCLOPramide **OR** metoCLOPramide (REGLAN) injection, morphine injection, ondansetron **OR** ondansetron (ZOFRAN) IV, polyethylene glycol, sodium phosphate Continuous Infusions:    LOS: 7 days  Time spent: Greater than 50% of the 35 minute visit was spent in counseling/coordination of care for the patient as laid out in the A&P.   Lewie Chamber, MD [facility] 09/30/2019, 10:33 AM   Contact via secure chat.  To contact the attending provider between 7A-7P or the covering provider during after hours 7P-7A, please log into the web site [email or web address] and access using universal [place] password for that web site. If you do not have the password, please call the hospital operator.

## 2019-09-30 NOTE — Assessment & Plan Note (Signed)
Postsurgically, patient had episodes of dizziness and low blood pressure on getting up.   -Patient was adequately hydrated with normal saline.  Fluid is stopped.  Blood pressure stable.  No more episode of dizziness.

## 2019-09-30 NOTE — Assessment & Plan Note (Addendum)
Hemoglobin 10.6 on admission on 7/16. Now down to 7.5 g/dL on 9/38; large bruising over L hip.  Hemoglobin stable prior to discharge but still low and will need monitoring - see hip fx

## 2019-09-30 NOTE — Assessment & Plan Note (Signed)
Chronic and stable. On Singulair, steroid inhalers and albuterol as needed. -Not on supplemental oxygen.

## 2019-10-01 LAB — BASIC METABOLIC PANEL
Anion gap: 7 (ref 5–15)
BUN: 12 mg/dL (ref 8–23)
CO2: 27 mmol/L (ref 22–32)
Calcium: 8.4 mg/dL — ABNORMAL LOW (ref 8.9–10.3)
Chloride: 106 mmol/L (ref 98–111)
Creatinine, Ser: 0.6 mg/dL (ref 0.44–1.00)
GFR calc Af Amer: >60 mL/min (ref >60)
GFR calc non Af Amer: >60 mL/min (ref >60)
Glucose, Bld: 101 mg/dL — ABNORMAL HIGH (ref 70–99)
Potassium: 3.6 mmol/L (ref 3.5–5.1)
Sodium: 140 mmol/L (ref 135–145)

## 2019-10-01 LAB — CBC WITH DIFFERENTIAL/PLATELET
Abs Immature Granulocytes: 0.04 10*3/uL (ref 0.00–0.07)
Basophils Absolute: 0 10*3/uL (ref 0.0–0.1)
Basophils Relative: 1 %
Eosinophils Absolute: 0.4 10*3/uL (ref 0.0–0.5)
Eosinophils Relative: 6 %
HCT: 23.8 % — ABNORMAL LOW (ref 36.0–46.0)
Hemoglobin: 7.3 g/dL — ABNORMAL LOW (ref 12.0–15.0)
Immature Granulocytes: 1 %
Lymphocytes Relative: 17 %
Lymphs Abs: 1.1 10*3/uL (ref 0.7–4.0)
MCH: 30.2 pg (ref 26.0–34.0)
MCHC: 30.7 g/dL (ref 30.0–36.0)
MCV: 98.3 fL (ref 80.0–100.0)
Monocytes Absolute: 0.5 10*3/uL (ref 0.1–1.0)
Monocytes Relative: 8 %
Neutro Abs: 4.2 10*3/uL (ref 1.7–7.7)
Neutrophils Relative %: 67 %
Platelets: 368 10*3/uL (ref 150–400)
RBC: 2.42 MIL/uL — ABNORMAL LOW (ref 3.87–5.11)
RDW: 13.7 % (ref 11.5–15.5)
WBC: 6.2 10*3/uL (ref 4.0–10.5)
nRBC: 0 % (ref 0.0–0.2)

## 2019-10-01 LAB — MAGNESIUM: Magnesium: 2.3 mg/dL (ref 1.7–2.4)

## 2019-10-01 LAB — SARS CORONAVIRUS 2 BY RT PCR (HOSPITAL ORDER, PERFORMED IN ~~LOC~~ HOSPITAL LAB): SARS Coronavirus 2: NEGATIVE

## 2019-10-01 MED ORDER — ASPIRIN 325 MG PO TABS: 325.0000 mg | ORAL_TABLET | Freq: Two times a day (BID) | ORAL | 0 refills | Status: AC

## 2019-10-01 MED ORDER — PANTOPRAZOLE SODIUM 40 MG PO TBEC: 40.0000 mg | DELAYED_RELEASE_TABLET | Freq: Two times a day (BID) | ORAL | Status: DC

## 2019-10-01 MED ORDER — OXYCODONE HCL 5 MG PO TABS: 5.0000 mg | ORAL_TABLET | ORAL | 0 refills | Status: AC | PRN

## 2019-10-01 NOTE — Plan of Care (Signed)
  Problem: Activity: Goal: Ability to avoid complications of mobility impairment will improve Outcome: Progressing Goal: Ability to tolerate increased activity will improve Outcome: Progressing   Problem: Education: Goal: Verbalization of understanding the information provided will improve Outcome: Progressing   Problem: Coping: Goal: Level of anxiety will decrease Outcome: Progressing   Problem: Physical Regulation: Goal: Postoperative complications will be avoided or minimized Outcome: Progressing   Problem: Respiratory: Goal: Ability to maintain a clear airway will improve Outcome: Progressing   Problem: Pain Management: Goal: Pain level will decrease Outcome: Progressing   Problem: Skin Integrity: Goal: Signs of wound healing will improve Outcome: Progressing   Problem: Tissue Perfusion: Goal: Ability to maintain adequate tissue perfusion will improve Outcome: Progressing   Problem: Education: Goal: Knowledge of General Education information will improve Description: Including pain rating scale, medication(s)/side effects and non-pharmacologic comfort measures Outcome: Progressing   Problem: Health Behavior/Discharge Planning: Goal: Ability to manage health-related needs will improve Outcome: Progressing   Problem: Clinical Measurements: Goal: Ability to maintain clinical measurements within normal limits will improve Outcome: Progressing Goal: Will remain free from infection Outcome: Progressing Goal: Diagnostic test results will improve Outcome: Progressing Goal: Respiratory complications will improve Outcome: Progressing Goal: Cardiovascular complication will be avoided Outcome: Progressing   Problem: Activity: Goal: Risk for activity intolerance will decrease Outcome: Progressing   Problem: Nutrition: Goal: Adequate nutrition will be maintained Outcome: Progressing   Problem: Coping: Goal: Level of anxiety will decrease Outcome: Progressing    Problem: Elimination: Goal: Will not experience complications related to bowel motility Outcome: Progressing Goal: Will not experience complications related to urinary retention Outcome: Progressing   Problem: Pain Managment: Goal: General experience of comfort will improve Outcome: Progressing   Problem: Safety: Goal: Ability to remain free from injury will improve Outcome: Progressing   Problem: Skin Integrity: Goal: Risk for impaired skin integrity will decrease Outcome: Progressing   

## 2019-10-01 NOTE — TOC Progression Note (Signed)
Transition of Care [facility]) - Progression Note    Patient Details  Name: Diana Potts MRN: [id number] Date of Birth: 1940/03/16  Transition of Care [facility]) CM/SW Contact  Verna Czech [place], [place] Phone Number: [phone] 10/01/2019, 12:59 PM  Clinical Narrative:    Phone call to Whitney at [place], patient can discharge to there after negative COVID test results are received. Patient can transition there if results are received by 4pm.  Tishie Altmann, LCSW Transitions of Care [phone]    Expected Discharge Plan: Skilled Nursing Facility Barriers to Discharge: No Barriers Identified  Expected Discharge Plan and Services Expected Discharge Plan: Skilled Nursing Facility                                               Social Determinants of Health (SDOH) Interventions    Readmission Risk Interventions No flowsheet data found.

## 2019-10-01 NOTE — TOC Progression Note (Signed)
Transition of Care [facility]) - Progression Note    Patient Details  Name: Diana Potts MRN: [id number] Date of Birth: 06-Aug-1940  Transition of Care [facility]) CM/SW Contact  [address], [place], [place] Phone Number: 10/01/2019, 4:13 PM  Clinical Narrative:    Patient to discharge to [facility]. Patient to be transported by [facility]. Patient going to room 7009. Report to be called in to [phone].  Wilmon Conover, LCSW Transitions of Care [phone]    Expected Discharge Plan: Skilled Nursing Facility Barriers to Discharge: No Barriers Identified  Expected Discharge Plan and Services Expected Discharge Plan: Skilled Nursing Facility         Expected Discharge Date: 10/01/19                                     Social Determinants of Health (SDOH) Interventions    Readmission Risk Interventions No flowsheet data found.

## 2019-10-01 NOTE — Progress Notes (Signed)
Discharge packet printed. Called report to [place].

## 2019-10-01 NOTE — Discharge Summary (Signed)
Physician Discharge Summary  Diana Potts FFM:[id number] DOB: [birth year] DOA: 09/23/2019  PCP: Patient, No Pcp Per  Admit date: 09/23/2019 Discharge date: 10/01/2019  Admitted From: home Disposition:  SNF  Discharging physician: Lewie Chamber, MD  Recommendations for Outpatient Follow-up:  1. Repeat Hgb intermittently 2. Follow up with ortho  Patient discharged to rehab in Discharge Condition: stable CODE STATUS: Full Diet recommendation:  Diet Orders (From admission, onward)    Start     Ordered   10/01/19 0000  Diet - low sodium heart healthy        10/01/19 1540   09/26/19 0802  Diet regular Room service appropriate? Yes; Fluid consistency: Thin  Diet effective now       Question Answer Comment  Room service appropriate? Yes   Fluid consistency: Thin      09/26/19 0801          Hospital Course: Diana Potts is a 79 year old female with history of COPD, chronic and stable, osteoporosis and anxiety.  Patient presented with mechanical fall and found to have a left femoral fracture.  Underwent ORIF on 7/17. She has been recovering well and working with PT however has large amount of bruising in her left hip and Hgb has been downtrending. This stabilized around ~7.5 g/dL and will need further intermittent monitoring. She has also been on NSAIDs at home prior to admission and now that she is on ASA 325 mg BID for DVT ppx s/p surgery she would continue to be a GIB risk and will need intermittent Hgb monitoring. She was informed to continue monitoring her stools for any changes and voiced understanding.   She will follow up with ortho at discharge as well.    Macrocytic anemia Hemoglobin 10.6 on admission on 7/16. Now down to 7.5 g/dL on 5/70; large bruising over L hip.  Hemoglobin stable prior to discharge but still low and will need monitoring - see hip fx  Hip fracture (HCC) -7/17, underwent Left hip ORIF -Continue pain control with Tylenol, Norco. -Aspirin 325 mg twice  daily for DVT prophylaxis per orthopedics. Continue Protonix while on aspirin twice daily. - Hgb continues to downtrend and she has large amount of bruising in left hip; no other concern for active bleed elsewhere at this time; does have some indigestion and hx hiatal hernia; on PPI as noted; there is also small risk of underlying GI bleed given home use of BC powder and other NSAIDs prior to admission and she will need intermittent monitoring of hemoglobin - d/c ibuprofen -PT/OT evaluation obtained.  SNF recommended.  Orthostatic hypotension Postsurgically, patient had episodes of dizziness and low blood pressure on getting up.   -Patient was adequately hydrated with normal saline.  Fluid is stopped.  Blood pressure stable.  No more episode of dizziness.  COPD (chronic obstructive pulmonary disease) (HCC) Chronic and stable. On Singulair, steroid inhalers and albuterol as needed. -Not on supplemental oxygen.  Anxiety Chronic and stable.  -Takes Prozac.  -Occasionally uses Ativan    The patient's chronic medical conditions were treated accordingly per the patient's home medication regimen except as noted.  On day of discharge, patient was felt deemed stable for discharge. Patient/family member advised to call PCP or come back to ER if needed.   Discharge Diagnoses:   Principal Diagnosis: Hip fracture [facility])  Active Hospital Problems   Diagnosis Date Noted  . Hip fracture (HCC) 09/23/2019    Priority: High  . Macrocytic anemia 09/24/2019  . COPD (chronic obstructive pulmonary  disease) (HCC) 09/24/2019  . Anxiety 09/24/2019    Resolved Hospital Problems   Diagnosis Date Noted Date Resolved  . Orthostatic hypotension 09/30/2019 09/30/2019    Discharge Instructions    Diet - low sodium heart healthy   Complete by: As directed    Discharge wound care:   Complete by: As directed    Follow up with ortho   Increase activity slowly   Complete by: As directed    No wound care    Complete by: As directed      Allergies as of 10/01/2019   No Known Allergies     Medication List    STOP taking these medications   BC HEADACHE POWDER PO     TAKE these medications   albuterol 108 (90 Base) MCG/ACT inhaler Commonly known as: VENTOLIN HFA Inhale 1 puff into the lungs every 6 (six) hours as needed for wheezing or shortness of breath.   ALPHA-LIPOIC ACID PO Take 2 capsules by mouth daily.   aspirin 325 MG tablet Take 1 tablet (325 mg total) by mouth 2 (two) times daily.   Co-Enzyme Q-10 100 MG Caps Take 100 mg by mouth daily.   COLLAGEN PO Take 3 capsules by mouth daily.   Digestive Enzyme Caps Take 1 capsule by mouth daily.   dorzolamide-timolol 22.3-6.8 MG/ML ophthalmic solution Commonly known as: COSOPT Place 1 drop into both eyes 2 (two) times daily.   FLUoxetine 10 MG capsule Commonly known as: PROZAC Take 10 mg by mouth at bedtime.   fluticasone 50 MCG/ACT nasal spray Commonly known as: FLONASE Place 2 sprays into both nostrils daily as needed for allergies.   latanoprost 0.005 % ophthalmic solution Commonly known as: XALATAN Place 1 drop into both eyes at bedtime.   LORazepam 0.5 MG tablet Commonly known as: ATIVAN Take 0.25 mg by mouth 2 (two) times daily as needed for anxiety or sleep.   montelukast 10 MG tablet Commonly known as: SINGULAIR Take 10 mg by mouth at bedtime.   oxyCODONE 5 MG immediate release tablet Commonly known as: Roxicodone Take 1 tablet (5 mg total) by mouth every 4 (four) hours as needed.   pantoprazole 40 MG tablet Commonly known as: PROTONIX Take 1 tablet (40 mg total) by mouth 2 (two) times daily.   phenylephrine 1 % nasal spray Commonly known as: NEO-SYNEPHRINE Place 1 drop into both nostrils every 6 (six) hours as needed for congestion.   Symbicort 160-4.5 MCG/ACT inhaler Generic drug: budesonide-formoterol Inhale 2 puffs into the lungs 2 (two) times daily.   VITAMIN D3 PO Take 2,000-3,000  Units by mouth daily. Vitamin D3 Liquid            Durable Medical Equipment  (From admission, onward)         Start     Ordered   09/27/19 0929  For home use only DME 3 n 1  Once        09/27/19 0929   09/27/19 0839  For home use only DME Walker rolling  Once       Question Answer Comment  Walker: With 5 Inch Wheels   Patient needs a walker to treat with the following condition Impaired mobility      09/27/19 0839           Discharge Care Instructions  (From admission, onward)         Start     Ordered   10/01/19 0000  Discharge wound care:  Comments: Follow up with ortho   10/01/19 1540          No Known Allergies  Consultations: Ortho   Procedures/Studies: DG C-Arm 1-60 Min  Result Date: 09/24/2019 CLINICAL DATA:  Left femoral intertrochanteric fracture. EXAM: DG HIP (WITH OR WITHOUT PELVIS) 2-3V LEFT; DG C-ARM 1-60 MIN COMPARISON:  02/23/2020 FINDINGS: Intraoperative fluoroscopic images demonstrate the left femoral intertrochanteric fracture. Intramedullary nail was placed in the left femur and interlocking screw was placed through the femoral head and neck. Intramedullary nail extends down to the distal femur. IMPRESSION: Internal fixation of the left femoral intertrochanteric fracture. Electronically Signed   By: Richarda Overlie M.D.   On: 09/24/2019 13:00   DG HIP UNILAT WITH PELVIS 2-3 VIEWS LEFT  Result Date: 09/24/2019 CLINICAL DATA:  Left femoral intertrochanteric fracture. EXAM: DG HIP (WITH OR WITHOUT PELVIS) 2-3V LEFT; DG C-ARM 1-60 MIN COMPARISON:  02/23/2020 FINDINGS: Intraoperative fluoroscopic images demonstrate the left femoral intertrochanteric fracture. Intramedullary nail was placed in the left femur and interlocking screw was placed through the femoral head and neck. Intramedullary nail extends down to the distal femur. IMPRESSION: Internal fixation of the left femoral intertrochanteric fracture. Electronically Signed   By: Richarda Overlie M.D.    On: 09/24/2019 13:00   DG Hip Unilat W or Wo Pelvis 2-3 Views Left  Result Date: 09/23/2019 CLINICAL DATA:  Fall, hip pain EXAM: DG HIP (WITH OR WITHOUT PELVIS) 2-3V LEFT COMPARISON:  None. FINDINGS: Acute intertrochanteric fracture of the left femur with displacement and varus angulation. No dislocation. IMPRESSION: Acute intertrochanteric fracture of the left femur with displacement and angulation. Electronically Signed   By: Guadlupe Spanish M.D.   On: 09/23/2019 20:53    Discharge Exam: BP (!) 132/66 (BP Location: Right Arm)   Pulse 79   Temp 98.9 F (37.2 C) (Oral)   Resp 15   Ht 5\' 6"  (1.676 m)   Wt 52.2 kg   SpO2 100%   BMI 18.56 kg/m  General appearance: alert, cooperative and no distress Head: Normocephalic, without obvious abnormality, atraumatic Eyes: EOMI Lungs: clear to auscultation bilaterally Heart: regular rate and rhythm and S1, S2 normal Abdomen: normal findings: bowel sounds normal and soft, non-tender Extremities: left hip noted with large bruising around surgical site and left thigh is swollen and larger than right; compartment is soft and minimally tender; bruising extends to posterior thigh and slightly up towards back Skin: left thigh and hip bruising noted Neurologic: Grossly normal   The results of significant diagnostics from this hospitalization (including imaging, microbiology, ancillary and laboratory) are listed below for reference.     Microbiology: Recent Results (from the past 240 hour(s))  SARS Coronavirus 2 by RT PCR (hospital order, performed in [facility] hospital lab) Nasopharyngeal Nasopharyngeal Swab     Status: None   Collection Time: 09/23/19  9:41 PM   Specimen: Nasopharyngeal Swab  Result Value Ref Range Status   SARS Coronavirus 2 NEGATIVE NEGATIVE Final    Comment: (NOTE) SARS-CoV-2 target nucleic acids are NOT DETECTED.  The SARS-CoV-2 RNA is generally detectable in upper and lower respiratory specimens during the acute phase of  infection. The lowest concentration of SARS-CoV-2 viral copies this assay can detect is 250 copies / mL. A negative result does not preclude SARS-CoV-2 infection and should not be used as the sole basis for treatment or other patient management decisions.  A negative result may occur with improper specimen collection / handling, submission of specimen other than nasopharyngeal swab, presence  of viral mutation(s) within the areas targeted by this assay, and inadequate number of viral copies (<250 copies / mL). A negative result must be combined with clinical observations, patient history, and epidemiological information.  Fact Sheet for Patients:   BoilerBrush.com.cy  Fact Sheet for Healthcare Providers: https://pope.com/  This test is not yet approved or  cleared by the [place] FDA and has been authorized for detection and/or diagnosis of SARS-CoV-2 by FDA under an Emergency Use Authorization (EUA).  This EUA will remain in effect (meaning this test can be used) for the duration of the COVID-19 declaration under Section 564(b)(1) of the Act, 21 U.S.C. section 360bbb-3(b)(1), unless the authorization is terminated or revoked sooner.  Performed at [facility], [address] [place], [place] [address]   MRSA PCR Screening     Status: None   Collection Time: 09/24/19  1:51 AM   Specimen: Nasal Mucosa; Nasopharyngeal  Result Value Ref Range Status   MRSA by PCR NEGATIVE NEGATIVE Final    Comment:        The GeneXpert MRSA Assay (FDA approved for NASAL specimens only), is one component of a comprehensive MRSA colonization surveillance program. It is not intended to diagnose MRSA infection nor to guide or monitor treatment for MRSA infections. Performed at [facility], [address], [place], [place] [address]   SARS Coronavirus 2 by RT PCR (hospital order, performed in [facility] hospital lab) Nasopharyngeal  Nasopharyngeal Swab     Status: None   Collection Time: 10/01/19  2:03 PM   Specimen: Nasopharyngeal Swab  Result Value Ref Range Status   SARS Coronavirus 2 NEGATIVE NEGATIVE Final    Comment: (NOTE) SARS-CoV-2 target nucleic acids are NOT DETECTED.  The SARS-CoV-2 RNA is generally detectable in upper and lower respiratory specimens during the acute phase of infection. The lowest concentration of SARS-CoV-2 viral copies this assay can detect is 250 copies / mL. A negative result does not preclude SARS-CoV-2 infection and should not be used as the sole basis for treatment or other patient management decisions.  A negative result may occur with improper specimen collection / handling, submission of specimen other than nasopharyngeal swab, presence of viral mutation(s) within the areas targeted by this assay, and inadequate number of viral copies (<250 copies / mL). A negative result must be combined with clinical observations, patient history, and epidemiological information.  Fact Sheet for Patients:   BoilerBrush.com.cy  Fact Sheet for Healthcare Providers: https://pope.com/  This test is not yet approved or  cleared by the [place] FDA and has been authorized for detection and/or diagnosis of SARS-CoV-2 by FDA under an Emergency Use Authorization (EUA).  This EUA will remain in effect (meaning this test can be used) for the duration of the COVID-19 declaration under Section 564(b)(1) of the Act, 21 U.S.C. section 360bbb-3(b)(1), unless the authorization is terminated or revoked sooner.  Performed at [facility], [address] [place], [place] [address]      Labs: BNP (last 3 results) No results for input(s): BNP in the last 8760 hours. Basic Metabolic Panel: Recent Labs  Lab 09/25/19 0238 09/27/19 0857 10/01/19 0329  NA 141 140 140  K 3.3* 3.9 3.6  CL 110 108 106  CO2 GLUCOSE 115* 109* 101*   BUN CREATININE 0.65 0.52 0.60  CALCIUM 8.2* 8.2* 8.4*  MG 2.1 2.0 2.3  PHOS 3.6 3.0  --  Liver Function Tests: No results for input(s): AST, ALT, ALKPHOS, BILITOT, PROT, ALBUMIN in the last 168 hours. No results for input(s): LIPASE, AMYLASE in the last 168 hours. No results for input(s): AMMONIA in the last 168 hours. CBC: Recent Labs  Lab 09/27/19 0857 09/27/19 0857 09/28/19 0324 09/29/19 0202 09/30/19 0406 09/30/19 1550 10/01/19 0329  WBC 10.7*  --  10.3 9.0 6.7  --  6.2  NEUTROABS 9.3*  --  8.3* 6.8 4.8  --  4.2  HGB 8.2*   < > 8.4* 8.2* 7.5* 7.9* 7.3*  HCT 26.3*   < > 26.8* 25.8* 24.0* 25.5* 23.8*  MCV 98.9  --  100.0 100.0 100.0  --  98.3  PLT 237  --  294 315 331  --  368   < > = values in this interval not displayed.   Cardiac Enzymes: No results for input(s): CKTOTAL, CKMB, CKMBINDEX, TROPONINI in the last 168 hours. BNP: Invalid input(s): POCBNP CBG: No results for input(s): GLUCAP in the last 168 hours. D-Dimer No results for input(s): DDIMER in the last 72 hours. Hgb A1c No results for input(s): HGBA1C in the last 72 hours. Lipid Profile No results for input(s): CHOL, HDL, LDLCALC, TRIG, CHOLHDL, LDLDIRECT in the last 72 hours. Thyroid function studies No results for input(s): TSH, T4TOTAL, T3FREE, THYROIDAB in the last 72 hours.  Invalid input(s): FREET3 Anemia work up No results for input(s): VITAMINB12, FOLATE, FERRITIN, TIBC, IRON, RETICCTPCT in the last 72 hours. Urinalysis No results found for: COLORURINE, APPEARANCEUR, LABSPEC, PHURINE, GLUCOSEU, HGBUR, BILIRUBINUR, KETONESUR, PROTEINUR, UROBILINOGEN, NITRITE, LEUKOCYTESUR Sepsis Labs Invalid input(s): PROCALCITONIN,  WBC,  LACTICIDVEN Microbiology Recent Results (from the past 240 hour(s))  SARS Coronavirus 2 by RT PCR (hospital order, performed in [facility] hospital lab) Nasopharyngeal Nasopharyngeal Swab     Status: None   Collection Time: 09/23/19  9:41 PM   Specimen:  Nasopharyngeal Swab  Result Value Ref Range Status   SARS Coronavirus 2 NEGATIVE NEGATIVE Final    Comment: (NOTE) SARS-CoV-2 target nucleic acids are NOT DETECTED.  The SARS-CoV-2 RNA is generally detectable in upper and lower respiratory specimens during the acute phase of infection. The lowest concentration of SARS-CoV-2 viral copies this assay can detect is 250 copies / mL. A negative result does not preclude SARS-CoV-2 infection and should not be used as the sole basis for treatment or other patient management decisions.  A negative result may occur with improper specimen collection / handling, submission of specimen other than nasopharyngeal swab, presence of viral mutation(s) within the areas targeted by this assay, and inadequate number of viral copies (<250 copies / mL). A negative result must be combined with clinical observations, patient history, and epidemiological information.  Fact Sheet for Patients:   BoilerBrush.com.cy  Fact Sheet for Healthcare Providers: https://pope.com/  This test is not yet approved or  cleared by the [place] FDA and has been authorized for detection and/or diagnosis of SARS-CoV-2 by FDA under an Emergency Use Authorization (EUA).  This EUA will remain in effect (meaning this test can be used) for the duration of the COVID-19 declaration under Section 564(b)(1) of the Act, 21 U.S.C. section 360bbb-3(b)(1), unless the authorization is terminated or revoked sooner.  Performed at [facility], [address], [place], [place] [address]   MRSA PCR Screening     Status: None   Collection Time: 09/24/19  1:51 AM   Specimen: Nasal Mucosa; Nasopharyngeal  Result Value Ref Range Status   MRSA  by PCR NEGATIVE NEGATIVE Final    Comment:        The GeneXpert MRSA Assay (FDA approved for NASAL specimens only), is one component of a comprehensive MRSA colonization surveillance program. It  is not intended to diagnose MRSA infection nor to guide or monitor treatment for MRSA infections. Performed at [facility], [address] [place], [place] [address]   SARS Coronavirus 2 by RT PCR (hospital order, performed in [facility] hospital lab) Nasopharyngeal Nasopharyngeal Swab     Status: None   Collection Time: 10/01/19  2:03 PM   Specimen: Nasopharyngeal Swab  Result Value Ref Range Status   SARS Coronavirus 2 NEGATIVE NEGATIVE Final    Comment: (NOTE) SARS-CoV-2 target nucleic acids are NOT DETECTED.  The SARS-CoV-2 RNA is generally detectable in upper and lower respiratory specimens during the acute phase of infection. The lowest concentration of SARS-CoV-2 viral copies this assay can detect is 250 copies / mL. A negative result does not preclude SARS-CoV-2 infection and should not be used as the sole basis for treatment or other patient management decisions.  A negative result may occur with improper specimen collection / handling, submission of specimen other than nasopharyngeal swab, presence of viral mutation(s) within the areas targeted by this assay, and inadequate number of viral copies (<250 copies / mL). A negative result must be combined with clinical observations, patient history, and epidemiological information.  Fact Sheet for Patients:   BoilerBrush.com.cyhttps://www.fda.gov/media/136312/download  Fact Sheet for Healthcare Providers: https://pope.com/https://www.fda.gov/media/136313/download  This test is not yet approved or  cleared by the [place] FDA and has been authorized for detection and/or diagnosis of SARS-CoV-2 by FDA under an Emergency Use Authorization (EUA).  This EUA will remain in effect (meaning this test can be used) for the duration of the COVID-19 declaration under Section 564(b)(1) of the Act, 21 U.S.C. section 360bbb-3(b)(1), unless the authorization is terminated or revoked sooner.  Performed at [facility], [address] [place],  [place] [address]      Time coordinating discharge: Over 30 minutes    Lewie Chamberavid Yevette Knust, MD  [facility] 10/01/2019, 3:49 PM Pager: Secure chat  If 7PM-7AM, please contact night-coverage [email or web address] Password [email or web address]

## 2019-10-01 NOTE — Plan of Care (Signed)
  Problem: Activity: Goal: Ability to avoid complications of mobility impairment will improve Outcome: Progressing Goal: Ability to tolerate increased activity will improve Outcome: Progressing   Problem: Pain Management: Goal: Pain level will decrease Outcome: Progressing   Problem: Skin Integrity: Goal: Signs of wound healing will improve Outcome: Progressing   Problem: Activity: Goal: Risk for activity intolerance will decrease Outcome: Progressing   Problem: Safety: Goal: Ability to remain free from injury will improve Outcome: Progressing   Problem: Skin Integrity: Goal: Risk for impaired skin integrity will decrease Outcome: Progressing

## 2021-07-07 IMAGING — CR DG HIP (WITH OR WITHOUT PELVIS) 2-3V*L*
3 series · 3 of 3 positions shown · non-contrast
Comparison: None.

CLINICAL DATA: Fall, hip pain

EXAM:
DG HIP (WITH OR WITHOUT PELVIS) 2-3V LEFT

[hip ap]
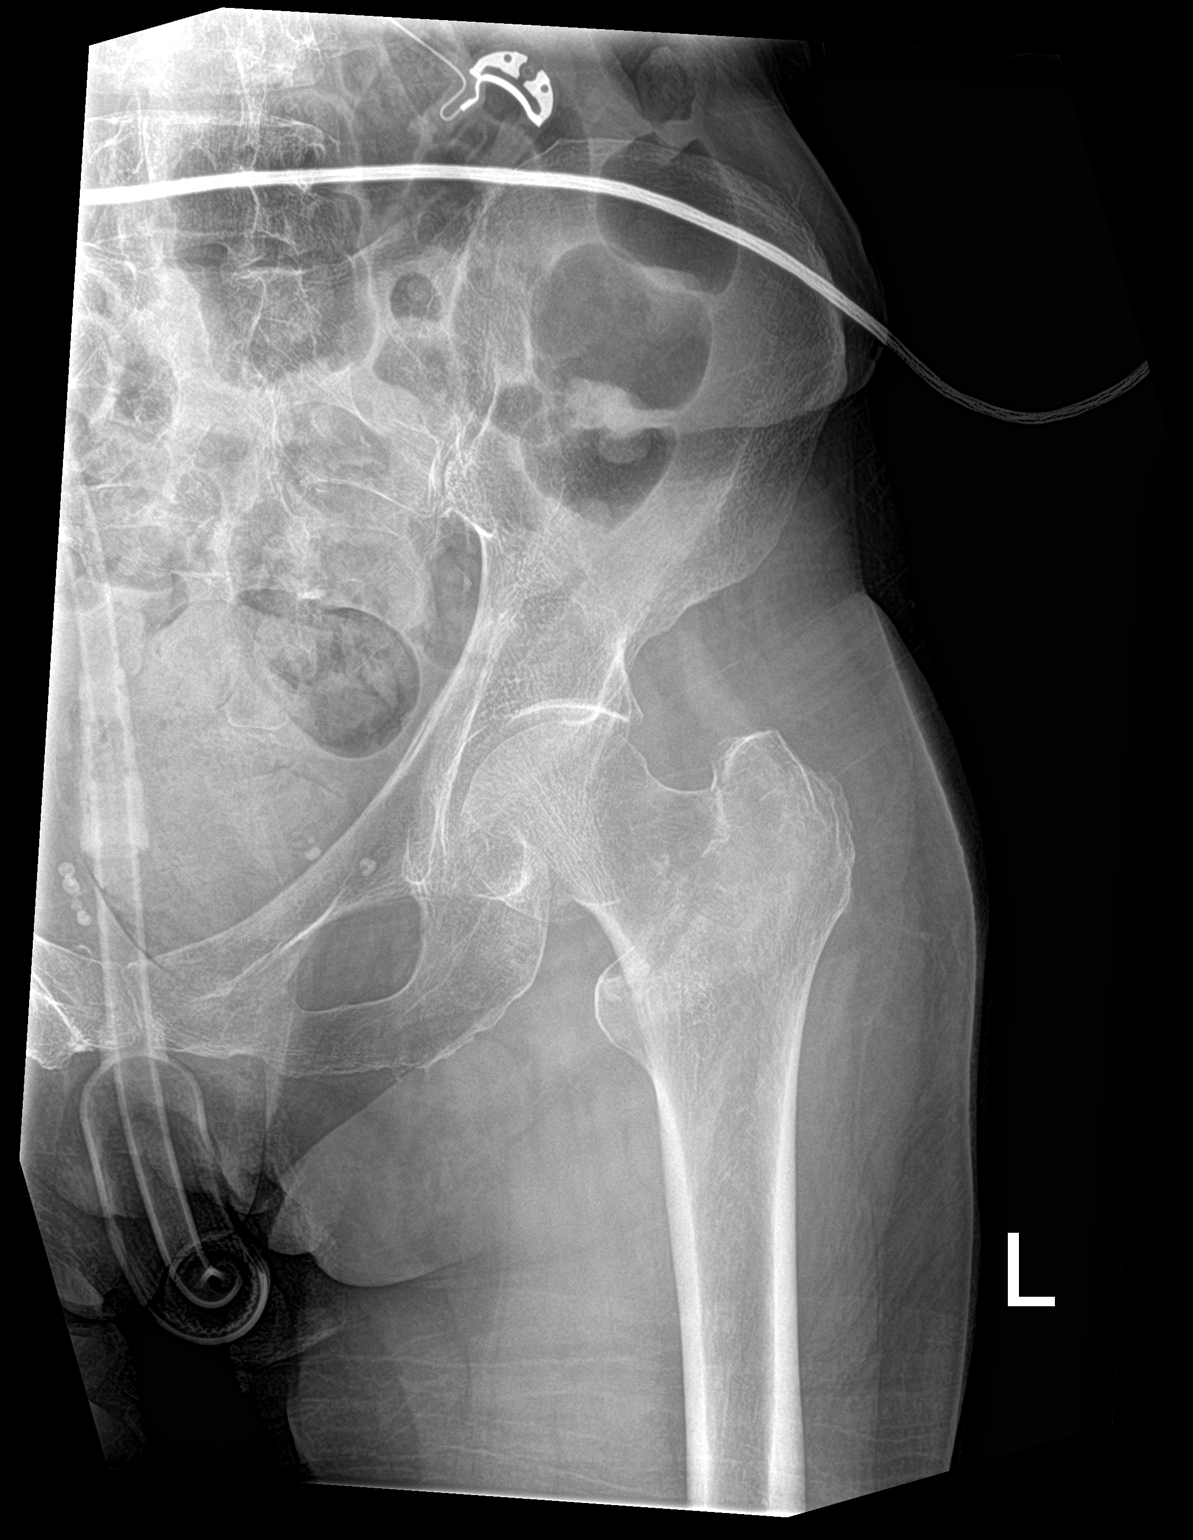

[hip lat]
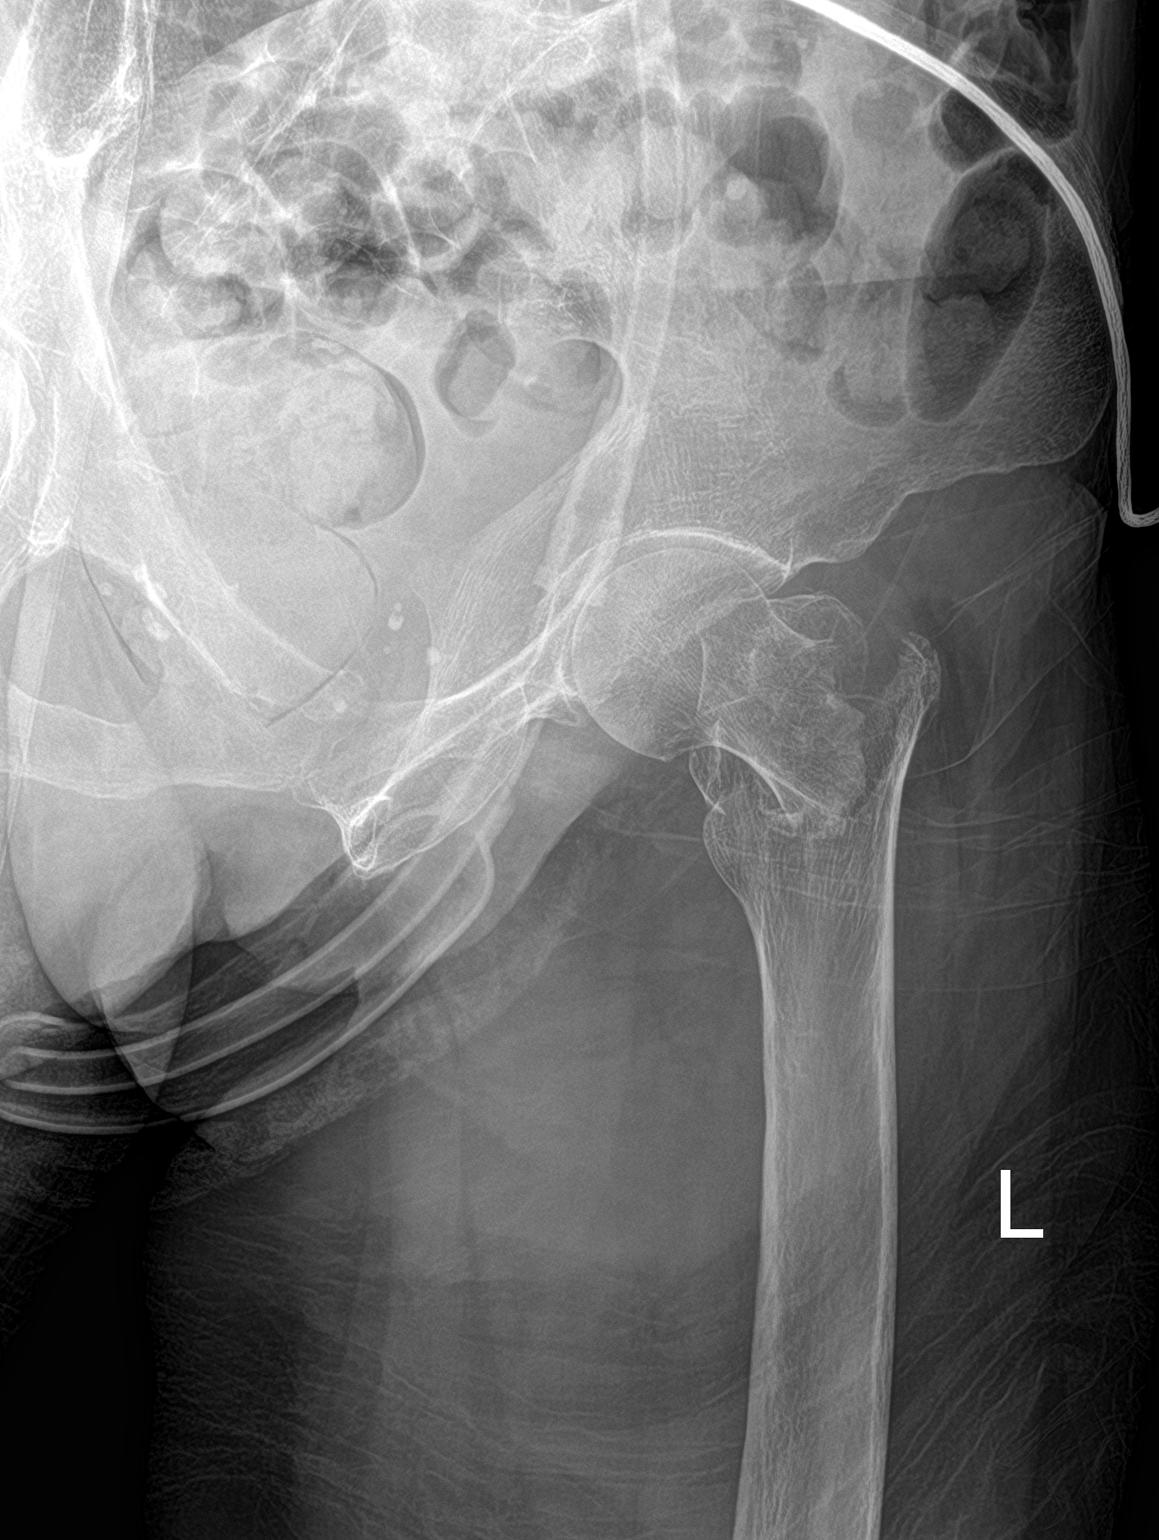

[pelvis ap]
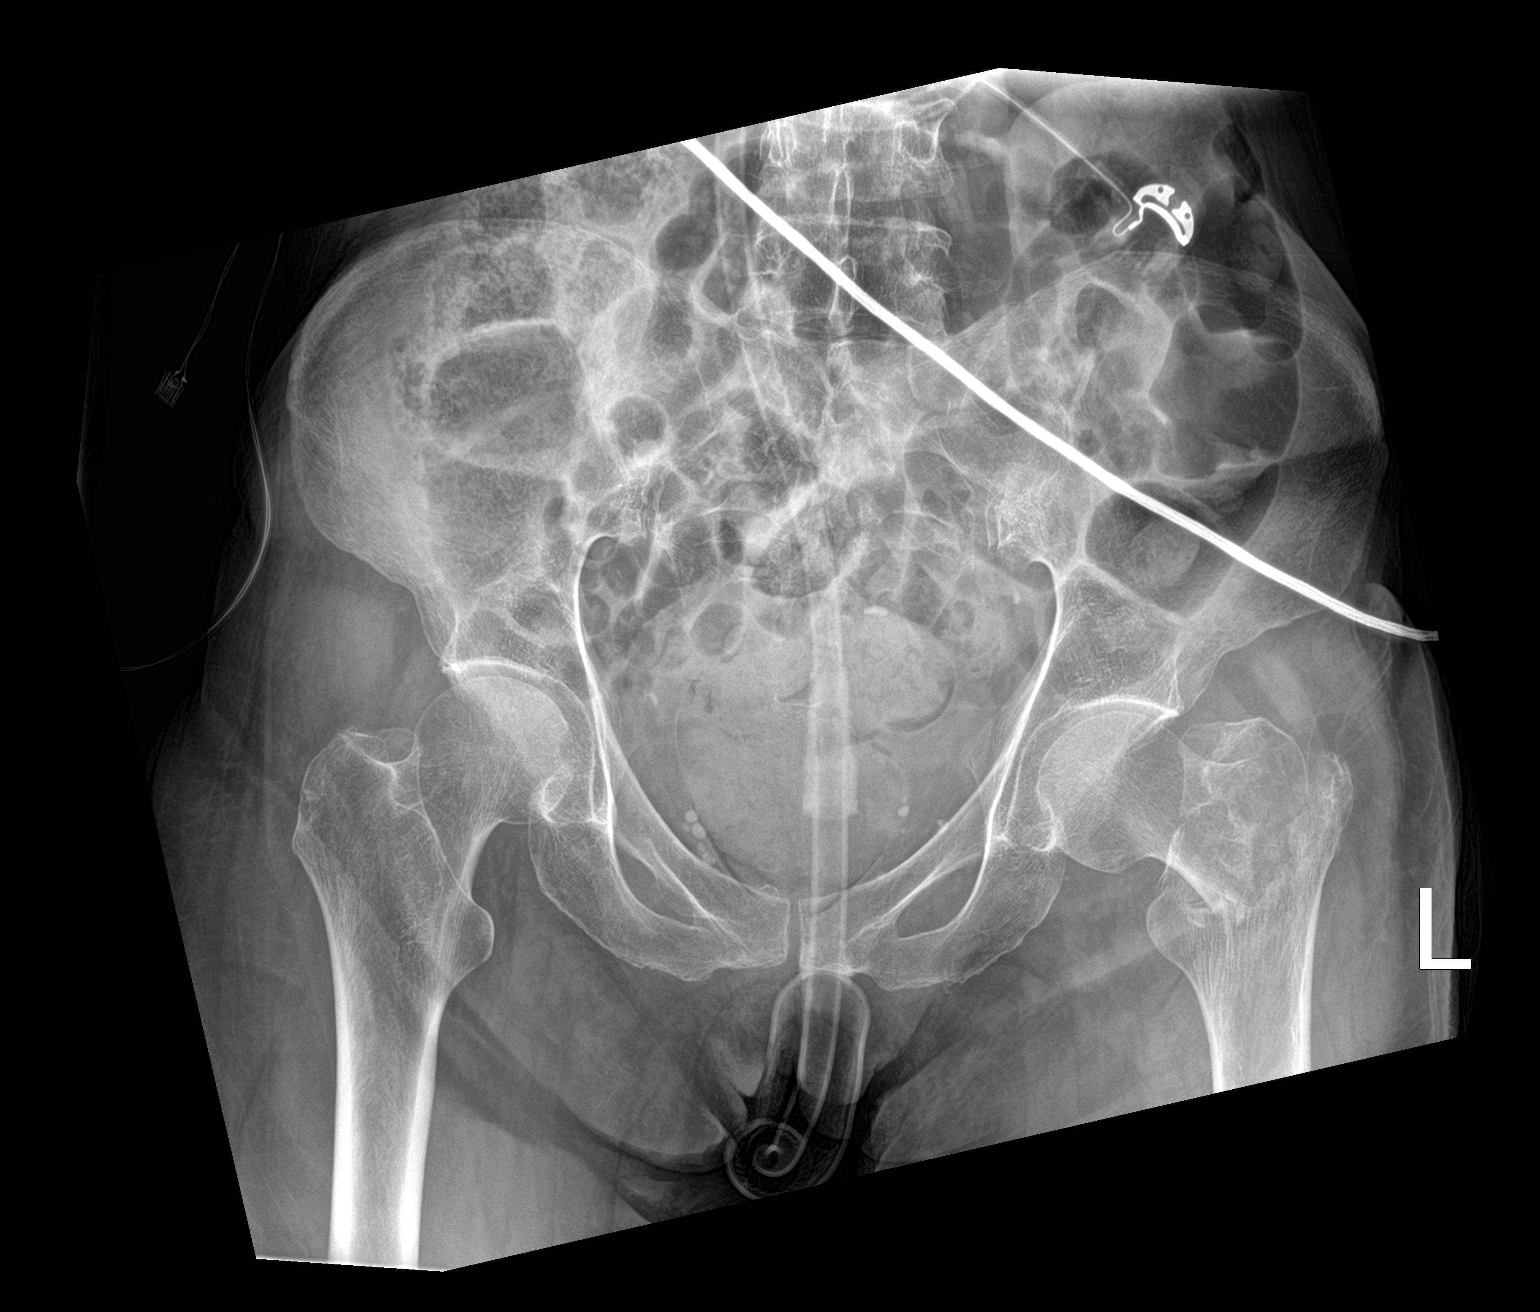

[3 of 3 positions shown; findings below may reference images not displayed]

FINDINGS: Acute intertrochanteric fracture of the left femur with displacement
and varus angulation. No dislocation.
IMPRESSION: Acute intertrochanteric fracture of the left femur with displacement
and angulation.

## 2021-07-08 IMAGING — RF DG HIP (WITH OR WITHOUT PELVIS) 2-3V*L*
1 series · 5 of 5 positions shown · non-contrast
Comparison: 02/23/2020

CLINICAL DATA: Left femoral intertrochanteric fracture.

EXAM:
DG HIP (WITH OR WITHOUT PELVIS) 2-3V LEFT; DG C-ARM 1-60 MIN

[Series 1: run · 5 of 5 slices shown]
[im 1/5]
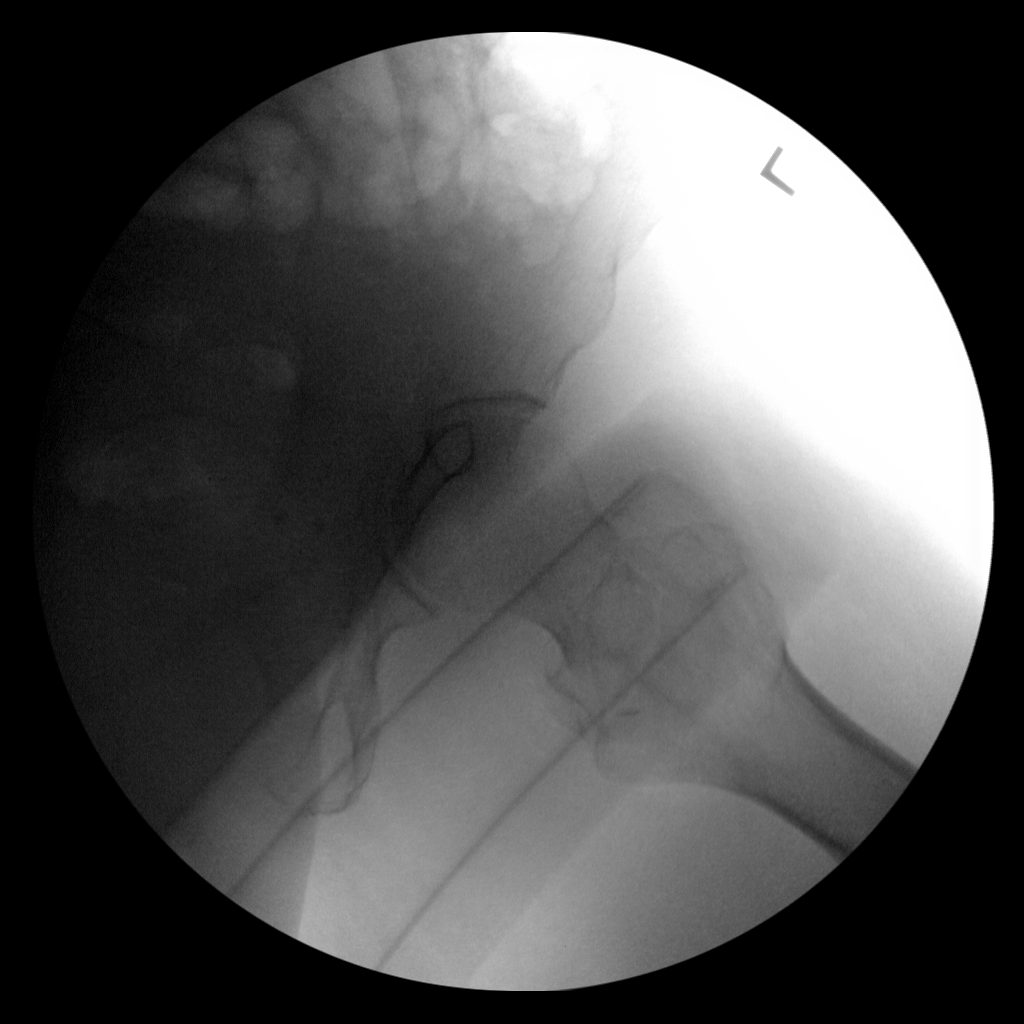
[im 2/5]
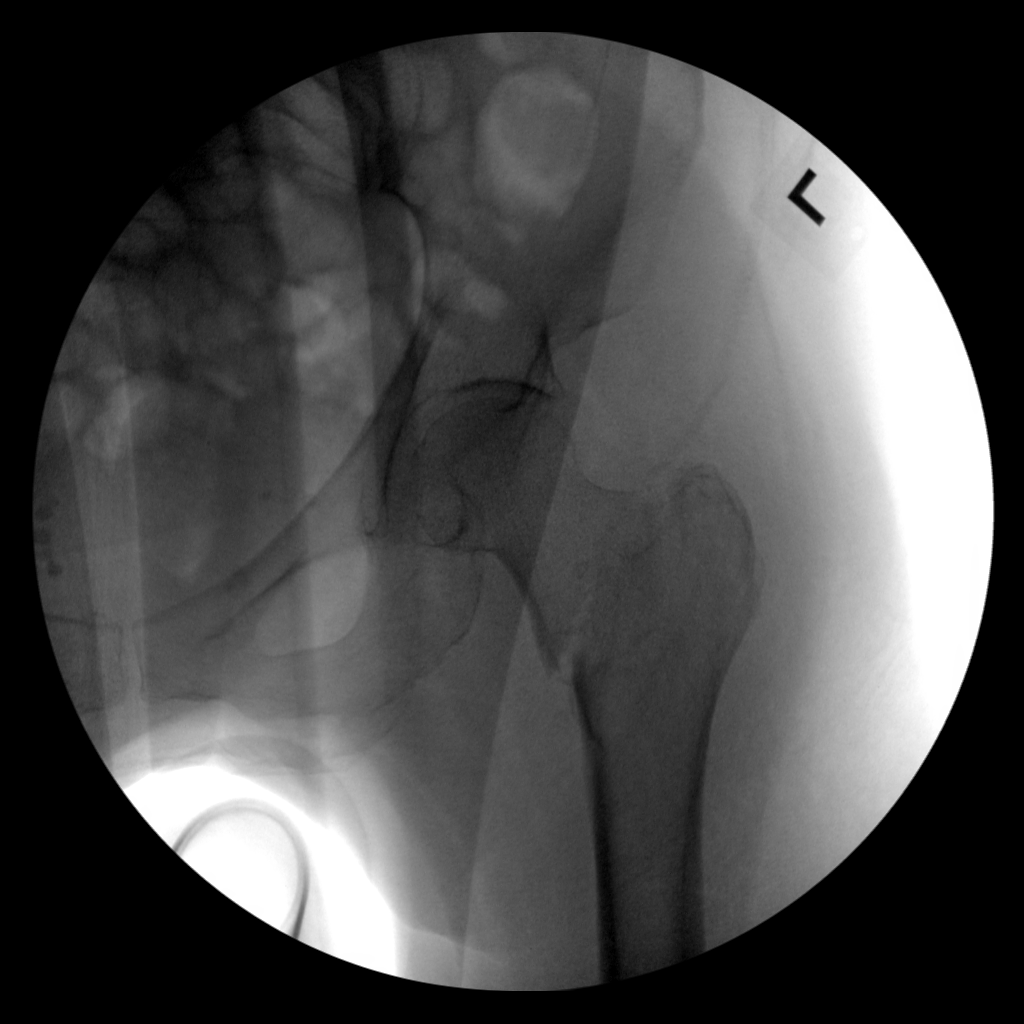
[im 3/5]
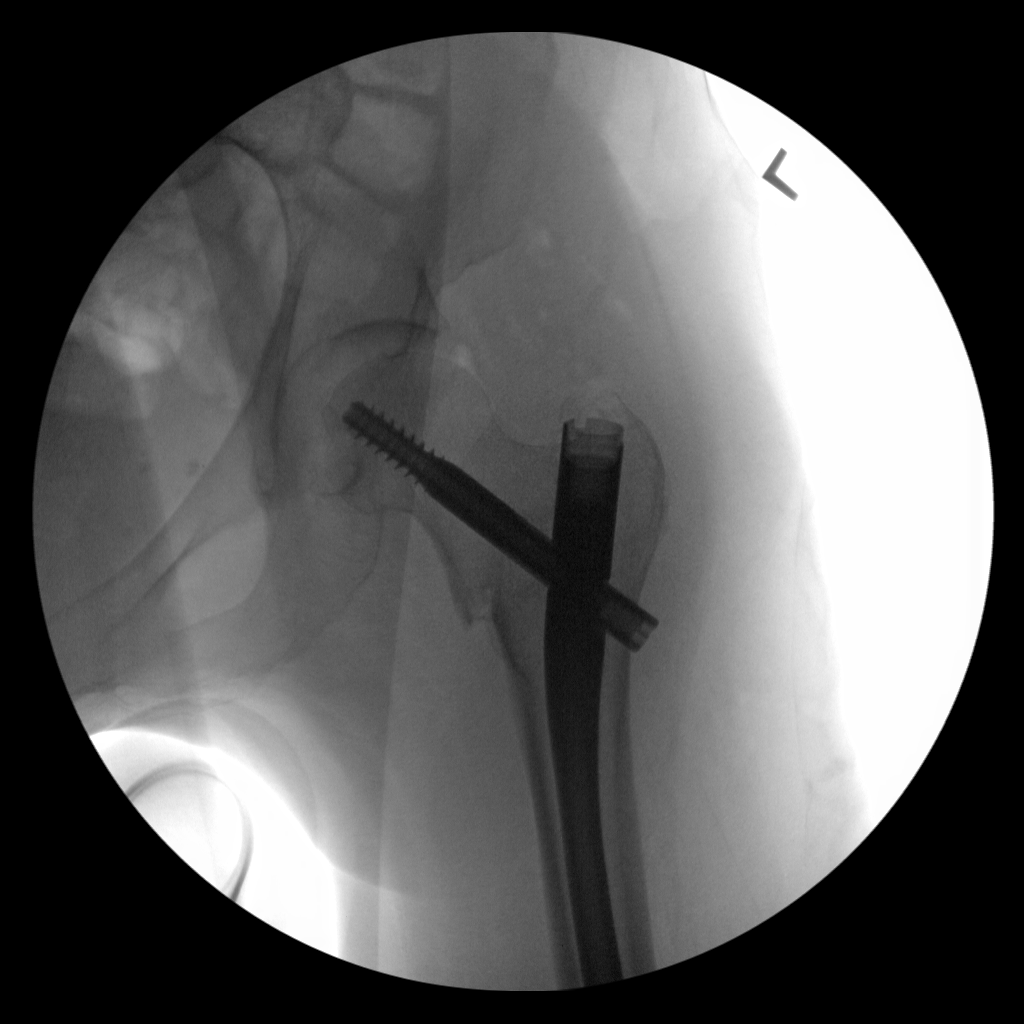
[im 4/5]
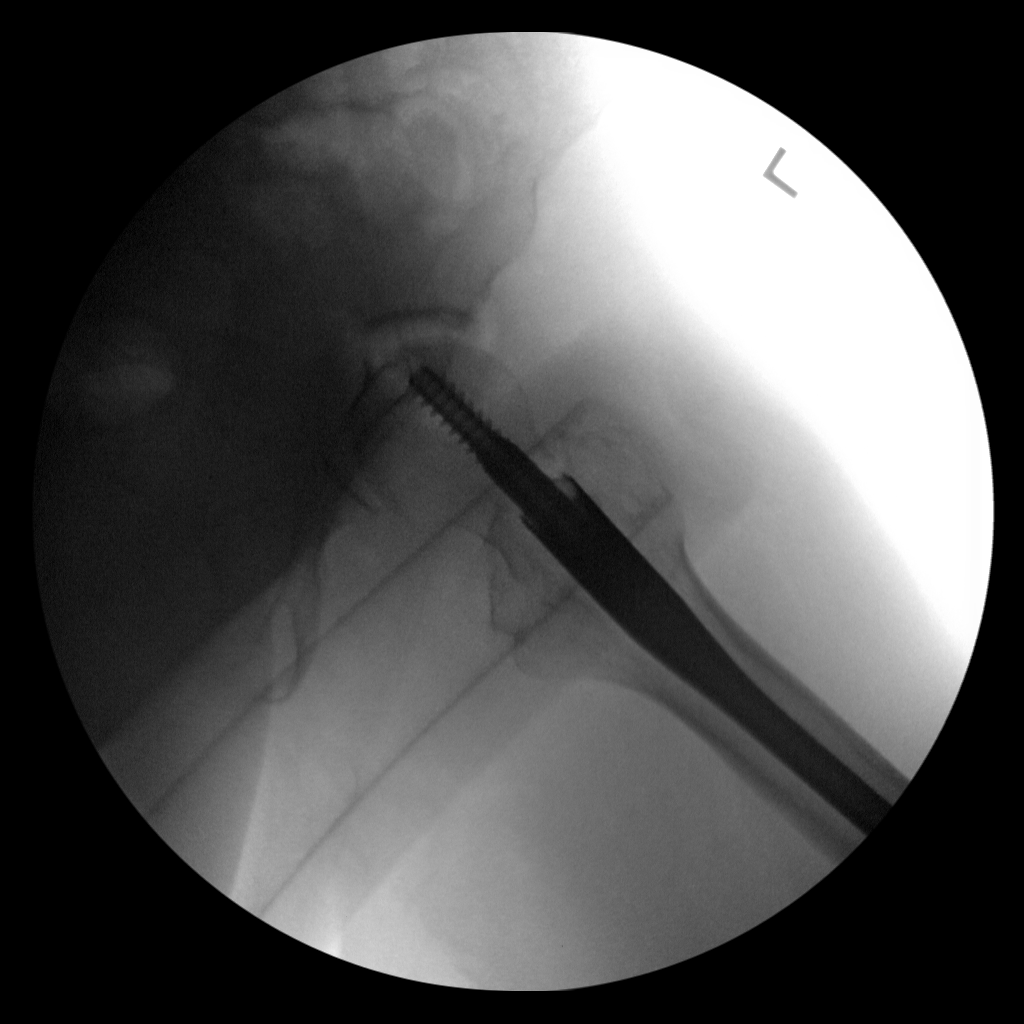
[im 5/5]
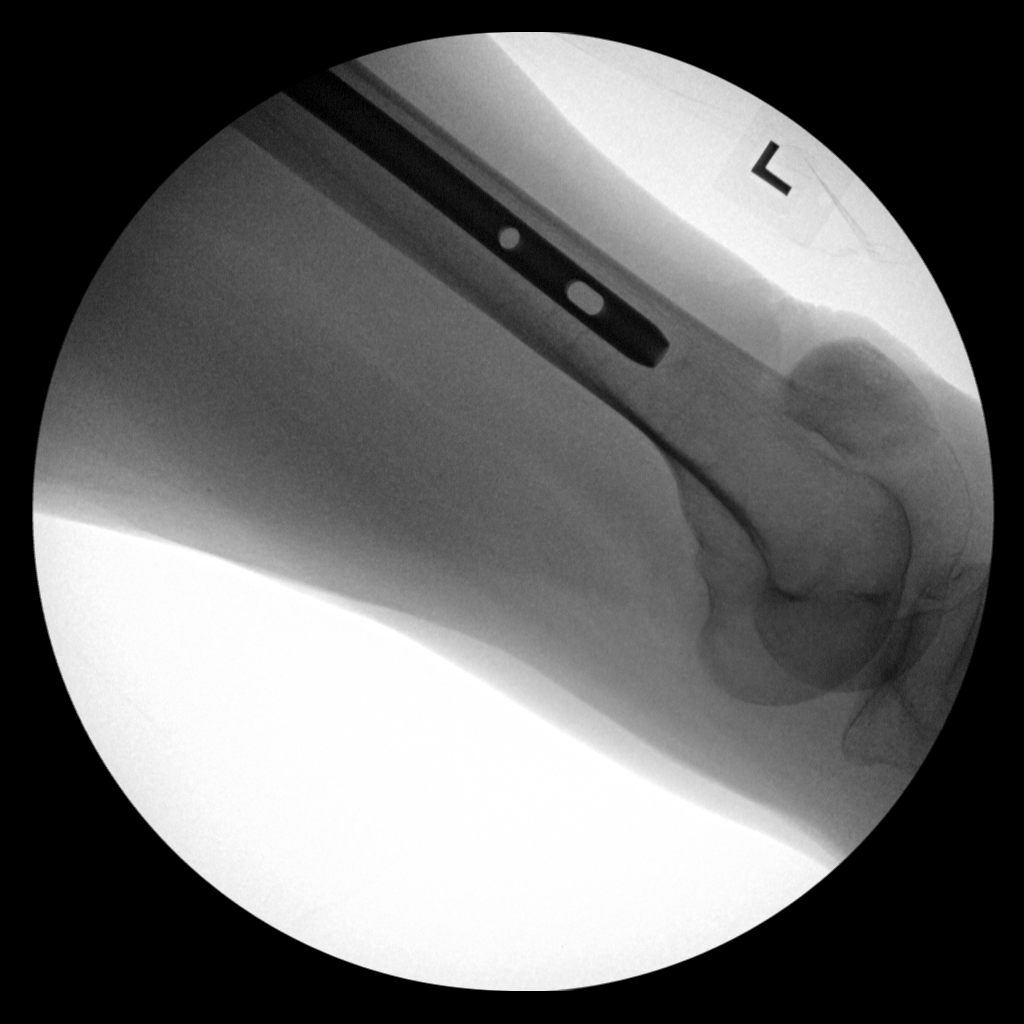

[5 of 5 positions shown; findings below may reference images not displayed]

FINDINGS: Intraoperative fluoroscopic images demonstrate the left femoral
intertrochanteric fracture. Intramedullary nail was placed in the
left femur and interlocking screw was placed through the femoral
head and neck. Intramedullary nail extends down to the distal femur.
IMPRESSION: Internal fixation of the left femoral intertrochanteric fracture.

## 2021-07-29 ENCOUNTER — Other Ambulatory Visit: Payer: Self-pay

## 2021-07-29 DIAGNOSIS — M810 Age-related osteoporosis without current pathological fracture: Secondary | ICD-10-CM | POA: Insufficient documentation

## 2021-07-30 ENCOUNTER — Telehealth: Payer: Self-pay | Admitting: Pharmacy Technician

## 2021-07-30 NOTE — Telephone Encounter (Signed)
Auth Submission: APPROVED- CHANGE IN SITE Payer: [employer] MEDICARE Medication & CPT/J Code(s) submitted: EVENITY [id number] Route of submission (phone, fax, portal): PHONE: [phone] Auth type: Buy/Bill Units/visits requested: Q4WKS Reference number: [id number] Approval from: 03/25/21 to 03/25/22   Change in site has been updated for [facility]. Ref#  [id number]

## 2021-08-15 ENCOUNTER — Ambulatory Visit: Payer: Medicare PPO

## 2021-08-22 ENCOUNTER — Ambulatory Visit (INDEPENDENT_AMBULATORY_CARE_PROVIDER_SITE_OTHER): Payer: Medicare PPO

## 2021-08-22 VITALS — BP 128/70 | HR 79 | Temp 98.7°F | Resp 18 | Ht 64.0 in | Wt 122.2 lb

## 2021-08-22 DIAGNOSIS — M810 Age-related osteoporosis without current pathological fracture: Secondary | ICD-10-CM | POA: Diagnosis not present

## 2021-08-22 MED ORDER — ROMOSOZUMAB-AQQG 105 MG/1.17ML ~~LOC~~ SOSY
210.0000 mg | PREFILLED_SYRINGE | Freq: Once | SUBCUTANEOUS | Status: AC
  Administered 2021-08-22: 210 mg via SUBCUTANEOUS

## 2021-08-27 NOTE — Progress Notes (Cosign Needed Addendum)
Diagnosis: Osteoporosis  Provider:  Chilton Greathouse, MD  Procedure: Injection  Evenity (Romosozumab-aqqg), Dose: 210 mg, Site: subcutaneous, Number of injections: 1  Discharge: Condition: Good, Destination: Home . AVS provided to patient.   Performed by:  Marilynn Rail, RN

## 2021-09-19 ENCOUNTER — Ambulatory Visit (INDEPENDENT_AMBULATORY_CARE_PROVIDER_SITE_OTHER): Payer: Medicare PPO

## 2021-09-19 VITALS — BP 134/78 | HR 84 | Temp 98.0°F | Resp 16 | Ht 64.0 in | Wt 124.8 lb

## 2021-09-19 DIAGNOSIS — M810 Age-related osteoporosis without current pathological fracture: Secondary | ICD-10-CM

## 2021-09-19 MED ORDER — ROMOSOZUMAB-AQQG 105 MG/1.17ML ~~LOC~~ SOSY
210.0000 mg | PREFILLED_SYRINGE | Freq: Once | SUBCUTANEOUS | Status: AC
  Administered 2021-09-19: 210 mg via SUBCUTANEOUS

## 2021-09-19 NOTE — Progress Notes (Signed)
Diagnosis: Osteoporosis  Provider:  Chilton Greathouse, MD  Procedure: Injection  Evenity (Romosozumab-aqqg), Dose: 105mg  , Site: subcutaneous, Number of injections: 2  Discharge: Condition: Good, Destination: Home . AVS provided to patient.   Performed by:  , RN

## 2021-10-17 ENCOUNTER — Encounter (INDEPENDENT_AMBULATORY_CARE_PROVIDER_SITE_OTHER): Payer: Self-pay | Admitting: Ophthalmology

## 2021-10-17 ENCOUNTER — Ambulatory Visit (INDEPENDENT_AMBULATORY_CARE_PROVIDER_SITE_OTHER): Payer: Medicare PPO | Admitting: Ophthalmology

## 2021-10-17 ENCOUNTER — Ambulatory Visit: Payer: Medicare PPO

## 2021-10-17 DIAGNOSIS — H353112 Nonexudative age-related macular degeneration, right eye, intermediate dry stage: Secondary | ICD-10-CM

## 2021-10-17 DIAGNOSIS — H353211 Exudative age-related macular degeneration, right eye, with active choroidal neovascularization: Secondary | ICD-10-CM | POA: Diagnosis not present

## 2021-10-17 DIAGNOSIS — H2511 Age-related nuclear cataract, right eye: Secondary | ICD-10-CM | POA: Diagnosis not present

## 2021-10-17 MED ORDER — BEVACIZUMAB CHEMO INJECTION 1.25MG/0.05ML SYRINGE FOR KALEIDOSCOPE
1.2500 mg | INTRAVITREAL | Status: AC | PRN
  Administered 2021-10-17: 1.25 mg via INTRAVITREAL

## 2021-10-17 NOTE — Progress Notes (Signed)
10/17/2021     CHIEF COMPLAINT Patient presents for  Chief Complaint  Patient presents with   Retina Evaluation      HISTORY OF PRESENT ILLNESS: Diana Potts is a 81 y.o. female who presents to the clinic today for:   HPI     Retina Evaluation           Laterality: right eye   Associated Symptoms: Negative for Flashes, Floaters, Distortion, Blind Spot, Pain, Redness, Photophobia, Glare, Trauma, Scalp Tenderness, Jaw Claudication, Shoulder/Hip pain, Fever, Weight Loss and Fatigue         Comments   ENP- Sudden onset WET Mac degeneration OD w/Macular edema Ref By Elliot Dally. Pt stated, "A couple of weeks ago, I noticed some squiggly lines in my right eye. I would see some in my left but mostly my right eye. I see some in the center of my vision but its like a big black blotch in the middle ." Pt is currently taking DORZ-TIMOLOL- 1 drop into both eyes daily LATANOPROST- 1 drop into both eyes at bedtime.         Last edited by Silvestre Moment on 10/17/2021  2:13 PM.      Referring physician: Clent Jacks, MD [address] [place],  [place] [address]  HISTORICAL INFORMATION:   Selected notes from the MEDICAL RECORD NUMBER       CURRENT MEDICATIONS: Current Outpatient Medications (Ophthalmic Drugs)  Medication Sig   dorzolamide-timolol (COSOPT) 22.3-6.8 MG/ML ophthalmic solution Place 1 drop into both eyes 2 (two) times daily.   latanoprost (XALATAN) 0.005 % ophthalmic solution Place 1 drop into both eyes at bedtime.   No current facility-administered medications for this visit. (Ophthalmic Drugs)   Current Outpatient Medications (Other)  Medication Sig   albuterol (VENTOLIN HFA) 108 (90 Base) MCG/ACT inhaler Inhale 1 puff into the lungs every 6 (six) hours as needed for wheezing or shortness of breath.   ALPHA-LIPOIC ACID PO Take 2 capsules by mouth daily.   Cholecalciferol (VITAMIN D3 PO) Take 2,000-3,000 Units by mouth daily. Vitamin D3 Liquid   Co-Enzyme  Q-10 100 MG CAPS Take 100 mg by mouth daily.   COLLAGEN PO Take 3 capsules by mouth daily.   Digestive Enzyme CAPS Take 1 capsule by mouth daily.   FLUoxetine (PROZAC) 10 MG capsule Take 10 mg by mouth at bedtime.   fluticasone (FLONASE) 50 MCG/ACT nasal spray Place 2 sprays into both nostrils daily as needed for allergies.   LORazepam (ATIVAN) 0.5 MG tablet Take 0.25 mg by mouth 2 (two) times daily as needed for anxiety or sleep.   montelukast (SINGULAIR) 10 MG tablet Take 10 mg by mouth at bedtime.   pantoprazole (PROTONIX) 40 MG tablet Take 1 tablet (40 mg total) by mouth 2 (two) times daily.   phenylephrine (NEO-SYNEPHRINE) 1 % nasal spray Place 1 drop into both nostrils every 6 (six) hours as needed for congestion.   SYMBICORT 160-4.5 MCG/ACT inhaler Inhale 2 puffs into the lungs 2 (two) times daily.   No current facility-administered medications for this visit. (Other)      REVIEW OF SYSTEMS: ROS   Negative for: Constitutional, Gastrointestinal, Neurological, Skin, Genitourinary, Musculoskeletal, HENT, Endocrine, Cardiovascular, Eyes, Respiratory, Psychiatric, Allergic/Imm, Heme/Lymph Last edited by Silvestre Moment on 10/17/2021  2:13 PM.       ALLERGIES No Known Allergies  PAST MEDICAL HISTORY History reviewed. No pertinent past medical history. Past Surgical History:  Procedure Laterality Date   INTRAMEDULLARY (  IM) NAIL INTERTROCHANTERIC Left 09/24/2019   Procedure: INTRAMEDULLARY (IM) NAIL, HIP;  Surgeon: Renette Butters, MD;  Location: [facility];  Service: Orthopedics;  Laterality: Left;    FAMILY HISTORY History reviewed. No pertinent family history.  SOCIAL HISTORY Social History   Tobacco Use   Smoking status: Former   Smokeless tobacco: Never  Substance Use Topics   Alcohol use: Not Currently         OPHTHALMIC EXAM:  Base Eye Exam     Visual Acuity (ETDRS)       Right Left   Dist [place] 20/80 20/20   Dist ph [place] 20/40 -2          Tonometry (Tonopen, 2:19  PM)       Right Left   Pressure 16 16         Pupils       Pupils APD   Right PERRL None   Left PERRL None  Eyes previously dilated from Dr. Patrici Ranks office. Pt saw Dr. Katy Fitch about an hr 1/2 ago.        Visual Fields       Left Right    Full Full         Extraocular Movement       Right Left    Full, Ortho Full, Ortho         Neuro/Psych     Oriented x3: Yes         Dilation     Both eyes: 1.0% Mydriacyl @ 2:18 PM           Slit Lamp and Fundus Exam     External Exam       Right Left   External Normal Normal         Slit Lamp Exam       Right Left   Lids/Lashes Normal Normal   Conjunctiva/Sclera White and quiet White and quiet   Cornea Clear Clear   Anterior Chamber Deep and quiet Deep and quiet   Iris Round and reactive Round and reactive   Lens 2+ Nuclear sclerosis Centered posterior chamber intraocular lens   Anterior Vitreous Normal Normal         Fundus Exam       Right Left   Posterior Vitreous Posterior vitreous detachment Posterior vitreous detachment   Disc Normal Normal   Macula Macular thickening, Intraretinal hemorrhage, Subretinal hemorrhage Intermediate age related macular degeneration, no macular thickening, no hemorrhage   Vessels Normal Normal   Periphery Normal Normal            IMAGING AND PROCEDURES  Imaging and Procedures for 10/17/21  OCT, Retina - OU - Both Eyes       Right Eye Quality was good. Central Foveal Thickness: 305. Progression has no prior data. Findings include abnormal foveal contour, intraretinal fluid, subretinal fluid.   Left Eye Quality was good. Scan locations included subfoveal. Central Foveal Thickness: 286. Progression has no prior data. Findings include abnormal foveal contour, retinal drusen .      Color Fundus Photography Optos - OU - Both Eyes       Right Eye Progression has no prior data. Disc findings include normal observations. Macula : edema, hemorrhage.  Vessels : normal observations. Periphery : normal observations.   Left Eye Progression has no prior data. Disc findings include normal observations. Macula : drusen. Vessels : normal observations. Periphery : normal observations.      Intravitreal Injection, Pharmacologic Agent - OD -  Right Eye       Time Out 10/17/2021. 2:46 PM. Confirmed correct patient, procedure, site, and patient consented.   Anesthesia Topical anesthesia was used. Anesthetic medications included Lidocaine 4%.   Procedure Preparation included 5% betadine to ocular surface, 10% betadine to eyelids, Tobramycin 0.3%. A 30 gauge needle was used.   Injection: 1.25 mg Bevacizumab 1.69m/0.05ml   Route: Intravitreal, Site: Right Eye   NDC: 50242-060-01   Post-op Post injection exam found visual acuity of at least counting fingers. The patient tolerated the procedure well. There were no complications. The patient received written and verbal post procedure care education. Post injection medications included gentamicin.              ASSESSMENT/PLAN:  Nuclear sclerotic cataract of right eye Follow-up cataract evaluation Dr. GKaty Fitchas scheduled, currently not symptomatic  Exudative age-related macular degeneration of right eye with active choroidal neovascularization ([place] foveal lesion superonasal aspect to FAZ, with intraretinal subretinal fluid by OCT commence therapy today  Intermediate stage nonexudative age-related macular degeneration of right eye Intermediate ARMD OU.  Candidate for AREDS 2      ICD-10-CM   1. Exudative age-related macular degeneration of right eye with active choroidal neovascularization (HCC)  H35.3211 OCT, Retina - OU - Both Eyes    Color Fundus Photography Optos - OU - Both Eyes    Intravitreal Injection, Pharmacologic Agent - OD - Right Eye    Bevacizumab (AVASTIN) SOLN 1.25 mg    CANCELED: Intravitreal Injection, Pharmacologic Agent - OS - Left Eye    2. Nuclear  sclerotic cataract of right eye  H25.11     3. Intermediate stage nonexudative age-related macular degeneration of right eye  H35.3112       1.  OD, you onset CNVM.  Will need to commence therapy today.  Risk benefits reviewed with the patient.  Avastin use commenced today  2.  OS, intermittent ARMD observe  3.  Moderate NSC OD  Ophthalmic Meds Ordered this visit:  Meds ordered this encounter  Medications   Bevacizumab (AVASTIN) SOLN 1.25 mg       Return in about 5 weeks (around 11/21/2021) for dilate, OD, AVASTIN OCT.  There are no Patient Instructions on file for this visit.   Explained the diagnoses, plan, and follow up with the patient and they expressed understanding.  Patient expressed understanding of the importance of proper follow up care.   GClent DemarkRankin M.D. Diseases & Surgery of the Retina and Vitreous Retina & Diabetic [place]08/10/23     Abbreviations: M myopia (nearsighted); A astigmatism; H hyperopia (farsighted); P presbyopia; Mrx spectacle prescription;  CTL contact lenses; OD right eye; OS left eye; OU both eyes  XT exotropia; ET esotropia; PEK punctate epithelial keratitis; PEE punctate epithelial erosions; DES dry eye syndrome; MGD meibomian gland dysfunction; ATs artificial tears; PFAT's preservative free artificial tears; [place] s[place] cataract; PSC posterior subcapsular cataract; ERM epi-retinal membrane; PVD posterior vitreous detachment; RD retinal detachment; DM diabetes mellitus; DR diabetic retinopathy; NPDR non-proliferative diabetic retinopathy; PDR proliferative diabetic retinopathy; CSME clinically significant macular edema; DME diabetic macular edema; dbh dot blot hemorrhages; CWS cotton wool spot; POAG primary open angle glaucoma; C/D cup-to-disc ratio; HVF humphrey visual field; GVF goldmann visual field; OCT optical coherence tomography; IOP intraocular pressure; BRVO Branch retinal vein occlusion; CRVO central retinal vein  occlusion; CRAO central retinal artery occlusion; BRAO branch retinal artery occlusion; RT retinal tear; SB scleral buckle; PPV pars plana vitrectomy; VH Vitreous  hemorrhage; PRP panretinal laser photocoagulation; IVK intravitreal kenalog; VMT vitreomacular traction; MH Macular hole;  NVD neovascularization of the disc; NVE neovascularization elsewhere; AREDS age related eye disease study; ARMD age related macular degeneration; POAG primary open angle glaucoma; EBMD epithelial/anterior basement membrane dystrophy; ACIOL anterior chamber intraocular lens; IOL intraocular lens; PCIOL posterior chamber intraocular lens; Phaco/IOL phacoemulsification with intraocular lens placement; [place] photorefractive keratectomy; LASIK laser assisted in situ keratomileusis; HTN hypertension; DM diabetes mellitus; COPD chronic obstructive pulmonary disease

## 2021-10-17 NOTE — Assessment & Plan Note (Signed)
Intermediate ARMD OU.  Candidate for AREDS 2

## 2021-10-17 NOTE — Assessment & Plan Note (Signed)
Juxta foveal lesion superonasal aspect to FAZ, with intraretinal subretinal fluid by OCT commence therapy today

## 2021-10-17 NOTE — Assessment & Plan Note (Signed)
Follow-up cataract evaluation Dr. Dione Booze as scheduled, currently not symptomatic

## 2021-11-05 ENCOUNTER — Ambulatory Visit (INDEPENDENT_AMBULATORY_CARE_PROVIDER_SITE_OTHER): Payer: Medicare PPO

## 2021-11-05 VITALS — BP 138/71 | HR 60 | Temp 98.6°F | Resp 16 | Ht 64.0 in | Wt 124.2 lb

## 2021-11-05 DIAGNOSIS — M810 Age-related osteoporosis without current pathological fracture: Secondary | ICD-10-CM

## 2021-11-05 MED ORDER — ROMOSOZUMAB-AQQG 105 MG/1.17ML ~~LOC~~ SOSY
210.0000 mg | PREFILLED_SYRINGE | Freq: Once | SUBCUTANEOUS | Status: AC
  Administered 2021-11-05: 210 mg via SUBCUTANEOUS

## 2021-11-05 NOTE — Progress Notes (Signed)
Diagnosis: Osteoporosis  Provider:  Praveen Mannam MD  Procedure: Injection  Evenity (Romosozumab-aqqg), Dose: 210 mg, Site: subcutaneous, Number of injections: 2  Discharge: Condition: Good, Destination: Home . AVS provided to patient.   Performed by:  Jarrett Albor E Vasiliki Smaldone, LPN       

## 2021-11-25 ENCOUNTER — Ambulatory Visit (INDEPENDENT_AMBULATORY_CARE_PROVIDER_SITE_OTHER): Payer: Medicare PPO | Admitting: Ophthalmology

## 2021-11-25 ENCOUNTER — Encounter (INDEPENDENT_AMBULATORY_CARE_PROVIDER_SITE_OTHER): Payer: Self-pay | Admitting: Ophthalmology

## 2021-11-25 DIAGNOSIS — H353121 Nonexudative age-related macular degeneration, left eye, early dry stage: Secondary | ICD-10-CM

## 2021-11-25 DIAGNOSIS — H2511 Age-related nuclear cataract, right eye: Secondary | ICD-10-CM | POA: Diagnosis not present

## 2021-11-25 DIAGNOSIS — H353211 Exudative age-related macular degeneration, right eye, with active choroidal neovascularization: Secondary | ICD-10-CM | POA: Diagnosis not present

## 2021-11-25 MED ORDER — BEVACIZUMAB CHEMO INJECTION 1.25MG/0.05ML SYRINGE FOR KALEIDOSCOPE
2.5000 mg | INTRAVITREAL | Status: AC | PRN
  Administered 2021-11-25: 2.5 mg via INTRAVITREAL

## 2021-11-25 NOTE — Progress Notes (Addendum)
11/25/2021     CHIEF COMPLAINT Patient presents for  Chief Complaint  Patient presents with   Macular Degeneration      HISTORY OF PRESENT ILLNESS: Diana Potts is a 81 y.o. female who presents to the clinic today for:   HPI   Age related macular degeneration of right eye 5 weeks dilate od avastin oct Pt states her vision has been stable Pt denies any new floaters or FOL Pt states her left eye has some wave to it when reading Last edited by Morene Rankins, CMA on 11/25/2021  2:40 PM.      Referring physician: No referring provider defined for this encounter.  HISTORICAL INFORMATION:   Selected notes from the MEDICAL RECORD NUMBER       CURRENT MEDICATIONS: Current Outpatient Medications (Ophthalmic Drugs)  Medication Sig   dorzolamide-timolol (COSOPT) 22.3-6.8 MG/ML ophthalmic solution Place 1 drop into both eyes 2 (two) times daily.   latanoprost (XALATAN) 0.005 % ophthalmic solution Place 1 drop into both eyes at bedtime.   No current facility-administered medications for this visit. (Ophthalmic Drugs)   Current Outpatient Medications (Other)  Medication Sig   albuterol (VENTOLIN HFA) 108 (90 Base) MCG/ACT inhaler Inhale 1 puff into the lungs every 6 (six) hours as needed for wheezing or shortness of breath.   ALPHA-LIPOIC ACID PO Take 2 capsules by mouth daily.   Cholecalciferol (VITAMIN D3 PO) Take 2,000-3,000 Units by mouth daily. Vitamin D3 Liquid   Co-Enzyme Q-10 100 MG CAPS Take 100 mg by mouth daily.   COLLAGEN PO Take 3 capsules by mouth daily.   Digestive Enzyme CAPS Take 1 capsule by mouth daily.   FLUoxetine (PROZAC) 10 MG capsule Take 10 mg by mouth at bedtime.   fluticasone (FLONASE) 50 MCG/ACT nasal spray Place 2 sprays into both nostrils daily as needed for allergies.   LORazepam (ATIVAN) 0.5 MG tablet Take 0.25 mg by mouth 2 (two) times daily as needed for anxiety or sleep.   montelukast (SINGULAIR) 10 MG tablet Take 10 mg by mouth at  bedtime.   pantoprazole (PROTONIX) 40 MG tablet Take 1 tablet (40 mg total) by mouth 2 (two) times daily.   phenylephrine (NEO-SYNEPHRINE) 1 % nasal spray Place 1 drop into both nostrils every 6 (six) hours as needed for congestion.   SYMBICORT 160-4.5 MCG/ACT inhaler Inhale 2 puffs into the lungs 2 (two) times daily.   No current facility-administered medications for this visit. (Other)      REVIEW OF SYSTEMS: ROS   Negative for: Constitutional, Gastrointestinal, Neurological, Skin, Genitourinary, Musculoskeletal, HENT, Endocrine, Cardiovascular, Eyes, Respiratory, Psychiatric, Allergic/Imm, Heme/Lymph Last edited by Morene Rankins, CMA on 11/25/2021  2:34 PM.       ALLERGIES No Known Allergies  PAST MEDICAL HISTORY History reviewed. No pertinent past medical history. Past Surgical History:  Procedure Laterality Date   INTRAMEDULLARY (IM) NAIL INTERTROCHANTERIC Left 09/24/2019   Procedure: INTRAMEDULLARY (IM) NAIL, HIP;  Surgeon: Renette Butters, MD;  Location: [facility];  Service: Orthopedics;  Laterality: Left;    FAMILY HISTORY History reviewed. No pertinent family history.  SOCIAL HISTORY Social History   Tobacco Use   Smoking status: Former   Smokeless tobacco: Never  Substance Use Topics   Alcohol use: Not Currently         OPHTHALMIC EXAM:  Base Eye Exam     Visual Acuity (ETDRS)       Right Left   Dist [place] 20/30 +3 20/20 -1  Tonometry (Tonopen, 2:38 PM)       Right Left   Pressure 5 8         Pupils       Pupils   Right PERRL   Left PERRL         Visual Fields       Left Right    Full Full         Extraocular Movement       Right Left    Ortho Ortho    -- -- --  --  --  -- -- --   -- -- --  --  --  -- -- --           Neuro/Psych     Oriented x3: Yes         Dilation     Right eye: 2.5% Phenylephrine, 1.0% Mydriacyl @ 2:36 PM           Slit Lamp and Fundus Exam     External Exam        Right Left   External Normal Normal         Slit Lamp Exam       Right Left   Lids/Lashes Normal Normal   Conjunctiva/Sclera White and quiet White and quiet   Cornea Clear Clear   Anterior Chamber Deep and quiet Deep and quiet   Iris Round and reactive Round and reactive   Lens 2+ Nuclear sclerosis Centered posterior chamber intraocular lens   Anterior Vitreous Normal Normal         Fundus Exam       Right Left   Posterior Vitreous Posterior vitreous detachment    Disc Normal    C/D Ratio 0.35    Macula Macular thickening, smaller intraretinal hemorrhage, less   Subretinal hemorrhage    Vessels Normal    Periphery Normal             IMAGING AND PROCEDURES  Imaging and Procedures for 11/25/21  OCT, Retina - OU - Both Eyes       Right Eye Quality was good. Central Foveal Thickness: 276. Progression has improved. Findings include abnormal foveal contour, intraretinal fluid, subretinal fluid.   Left Eye Quality was good. Scan locations included subfoveal. Central Foveal Thickness: 255. Progression has no prior data. Findings include abnormal foveal contour, retinal drusen .   Notes Subretinal fluid and perifoveal CME and intraretinal fluid much improved post injection of 1 Avastin      Intravitreal Injection, Pharmacologic Agent - OD - Right Eye       Time Out 11/25/2021. 3:01 PM. Confirmed correct patient, procedure, site, and patient consented.   Anesthesia Topical anesthesia was used. Anesthetic medications included Lidocaine 4%.   Procedure Preparation included 5% betadine to ocular surface, 10% betadine to eyelids, Tobramycin 0.3%. A 30 gauge needle was used.   Injection: 2.5 mg Bevacizumab 1.25mg /0.21ml   Route: Intravitreal, Site: Right Eye   NDC: H061816, Lot: [id number]   Post-op Post injection exam found visual acuity of at least counting fingers. The patient tolerated the procedure well. There were no complications. The patient received  written and verbal post procedure care education. Post injection medications included gentamicin.              ASSESSMENT/PLAN:  Exudative age-related macular degeneration of right eye with active choroidal neovascularization (HCC) Improved today at 5 weeks post injection #1 of Avastin for wet AMD.  Improved acuity and symptoms and findings repeat  injection today and reevaluate next in 6 weeks  Nuclear sclerotic cataract of right eye Stable no change     ICD-10-CM   1. Exudative age-related macular degeneration of right eye with active choroidal neovascularization (HCC)  H35.3211 OCT, Retina - OU - Both Eyes    Intravitreal Injection, Pharmacologic Agent - OD - Right Eye    Bevacizumab (AVASTIN) SOLN 2.5 mg    2. Nuclear sclerotic cataract of right eye  H25.11     3. 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  OD, wet AMD.  Improved overall macular findings and vision after the first injection Avastin.  Some 6 weeks previous.  Repeat injection today reevaluate next in 6 weeks  2.  3.  Ophthalmic Meds Ordered this visit:  Meds ordered this encounter  Medications   Bevacizumab (AVASTIN) SOLN 2.5 mg       Return in about 6 weeks (around 01/06/2022) for DILATE OU, AVASTIN OCT, OD.  There are no Patient Instructions on file for this visit.   Explained the diagnoses, plan, and follow up with the patient and they expressed understanding.  Patient expressed understanding of the importance of proper follow up care.   Clent Demark Maranda Marte M.D. Diseases & Surgery of the Retina and Vitreous Retina & Diabetic [place] 11/25/21     Abbreviations: M myopia (nearsighted); A astigmatism; H hyperopia (farsighted); P presbyopia; Mrx spectacle prescription;  CTL contact lenses; OD right eye; OS left eye; OU both eyes  XT exotropia; ET esotropia; PEK punctate epithelial keratitis; PEE punctate epithelial erosions; DES dry eye syndrome;  MGD meibomian gland dysfunction; ATs artificial tears; PFAT's preservative free artificial tears; [place] nuclear sclerotic cataract; PSC posterior subcapsular cataract; ERM epi-retinal membrane; PVD posterior vitreous detachment; RD retinal detachment; DM diabetes mellitus; DR diabetic retinopathy; NPDR non-proliferative diabetic retinopathy; PDR proliferative diabetic retinopathy; CSME clinically significant macular edema; DME diabetic macular edema; dbh dot blot hemorrhages; CWS cotton wool spot; POAG primary open angle glaucoma; C/D cup-to-disc ratio; HVF humphrey visual field; GVF goldmann visual field; OCT optical coherence tomography; IOP intraocular pressure; BRVO Branch retinal vein occlusion; CRVO central retinal vein occlusion; CRAO central retinal artery occlusion; BRAO branch retinal artery occlusion; RT retinal tear; SB scleral buckle; PPV pars plana vitrectomy; VH Vitreous hemorrhage; PRP panretinal laser photocoagulation; IVK intravitreal kenalog; VMT vitreomacular traction; MH Macular hole;  NVD neovascularization of the disc; NVE neovascularization elsewhere; AREDS age related eye disease study; ARMD age related macular degeneration; POAG primary open angle glaucoma; EBMD epithelial/anterior basement membrane dystrophy; ACIOL anterior chamber intraocular lens; IOL intraocular lens; PCIOL posterior chamber intraocular lens; Phaco/IOL phacoemulsification with intraocular lens placement; [place] photorefractive keratectomy; LASIK laser assisted in situ keratomileusis; HTN hypertension; DM diabetes mellitus; COPD chronic obstructive pulmonary disease

## 2021-11-25 NOTE — Assessment & Plan Note (Signed)
Improved today at 5 weeks post injection #1 of Avastin for wet AMD.  Improved acuity and symptoms and findings repeat injection today and reevaluate next in 6 weeks

## 2021-11-25 NOTE — Assessment & Plan Note (Signed)
Stable no change

## 2021-11-25 NOTE — Patient Instructions (Signed)
Patient to contact the office promptly for new onset visual symptoms.  Patient should call office 1 week prior to next visit to confirm ability to be seen as a patient

## 2021-12-03 ENCOUNTER — Ambulatory Visit (INDEPENDENT_AMBULATORY_CARE_PROVIDER_SITE_OTHER): Payer: Medicare PPO

## 2021-12-03 VITALS — BP 145/78 | HR 73 | Temp 98.2°F | Resp 16 | Ht 64.0 in | Wt 121.0 lb

## 2021-12-03 DIAGNOSIS — M810 Age-related osteoporosis without current pathological fracture: Secondary | ICD-10-CM

## 2021-12-03 MED ORDER — ROMOSOZUMAB-AQQG 105 MG/1.17ML ~~LOC~~ SOSY
210.0000 mg | PREFILLED_SYRINGE | Freq: Once | SUBCUTANEOUS | Status: AC
  Administered 2021-12-03: 210 mg via SUBCUTANEOUS

## 2021-12-03 NOTE — Progress Notes (Signed)
Diagnosis: Osteoporosis  Provider:  Praveen Mannam MD  Procedure: Injection  Evenity (Romosozumab-aqqg), Dose: 210 mg, Site: subcutaneous, Number of injections: 2  Discharge: Condition: Good, Destination: Home . AVS provided to patient.   Performed by:  Idan Prime, RN       

## 2021-12-31 ENCOUNTER — Ambulatory Visit (INDEPENDENT_AMBULATORY_CARE_PROVIDER_SITE_OTHER): Payer: Medicare PPO | Admitting: *Deleted

## 2021-12-31 VITALS — BP 147/77 | HR 73 | Temp 98.0°F | Resp 18 | Ht 64.0 in | Wt 121.2 lb

## 2021-12-31 DIAGNOSIS — M810 Age-related osteoporosis without current pathological fracture: Secondary | ICD-10-CM | POA: Diagnosis not present

## 2021-12-31 MED ORDER — ROMOSOZUMAB-AQQG 105 MG/1.17ML ~~LOC~~ SOSY
210.0000 mg | PREFILLED_SYRINGE | Freq: Once | SUBCUTANEOUS | Status: AC
  Administered 2021-12-31: 210 mg via SUBCUTANEOUS
  Filled 2021-12-31: qty 2.34

## 2021-12-31 NOTE — Progress Notes (Signed)
Diagnosis: Osteoporosis  Provider:  Praveen Mannam MD  Procedure: Injection  Evenity (Romosozumab-aqqg), Dose: 210 mg, Site: subcutaneous, Number of injections: 2  Post Care: Observation period completed  Discharge: Condition: Good, Destination: Home . AVS provided to patient.   Performed by:  Drucilla Cumber A, RN       

## 2022-01-06 ENCOUNTER — Encounter (INDEPENDENT_AMBULATORY_CARE_PROVIDER_SITE_OTHER): Payer: Medicare PPO | Admitting: Ophthalmology

## 2022-01-28 ENCOUNTER — Ambulatory Visit (INDEPENDENT_AMBULATORY_CARE_PROVIDER_SITE_OTHER): Payer: Medicare PPO

## 2022-01-28 VITALS — BP 149/66 | HR 79 | Temp 97.7°F | Resp 16 | Ht 64.0 in | Wt 120.2 lb

## 2022-01-28 DIAGNOSIS — M810 Age-related osteoporosis without current pathological fracture: Secondary | ICD-10-CM

## 2022-01-28 MED ORDER — ROMOSOZUMAB-AQQG 105 MG/1.17ML ~~LOC~~ SOSY
210.0000 mg | PREFILLED_SYRINGE | Freq: Once | SUBCUTANEOUS | Status: AC
  Administered 2022-01-28: 210 mg via SUBCUTANEOUS
  Filled 2022-01-28: qty 2.34

## 2022-01-28 NOTE — Progress Notes (Signed)
Diagnosis: Osteoporosis  Provider:  Praveen Mannam MD  Procedure: Injection  Evenity (Romosozumab-aqqg), Dose: 210 mg, Site: subcutaneous, Number of injections: 2  Post Care: Patient declined observation  Discharge: Condition: Good, Destination: Home . AVS provided to patient.   Performed by:  Emberlynn Riggan E Shrika Milos, LPN       

## 2022-02-25 ENCOUNTER — Ambulatory Visit: Payer: Medicare PPO

## 2022-02-27 ENCOUNTER — Ambulatory Visit (INDEPENDENT_AMBULATORY_CARE_PROVIDER_SITE_OTHER): Payer: Medicare PPO

## 2022-02-27 VITALS — BP 146/82 | HR 71 | Temp 97.6°F | Resp 18 | Ht 64.0 in | Wt 120.4 lb

## 2022-02-27 DIAGNOSIS — M810 Age-related osteoporosis without current pathological fracture: Secondary | ICD-10-CM

## 2022-02-27 MED ORDER — ROMOSOZUMAB-AQQG 105 MG/1.17ML ~~LOC~~ SOSY
210.0000 mg | PREFILLED_SYRINGE | Freq: Once | SUBCUTANEOUS | Status: AC
  Administered 2022-02-27: 210 mg via SUBCUTANEOUS
  Filled 2022-02-27: qty 2.34

## 2022-02-27 NOTE — Progress Notes (Signed)
Diagnosis: Osteoporosis  Provider:  Praveen Mannam MD  Procedure: Injection  Evenity (Romosozumab-aqqg), Dose: 210 mg, Site: subcutaneous, Number of injections: 2  Post Care:     Discharge: Condition: Good, Destination: Home . AVS provided to patient.   Performed by:  Izetta Sakamoto, RN       

## 2022-03-04 ENCOUNTER — Telehealth: Payer: Self-pay | Admitting: Pharmacy Technician

## 2022-03-04 NOTE — Telephone Encounter (Signed)
Auth Submission: APPROVED Payer: [employer] Medication & CPT/J Code(s) submitted: Evenity (Romosozumab) [id number] Route of submission (phone, fax, portal):  Phone # [phone] Fax # Auth type: Buy/Bill Units/visits requested: 12 Reference number: [id number] Approval from: 03/26/22 to 03/27/23

## 2022-03-14 ENCOUNTER — Other Ambulatory Visit: Payer: Self-pay | Admitting: Pharmacy Technician

## 2022-03-27 ENCOUNTER — Ambulatory Visit (INDEPENDENT_AMBULATORY_CARE_PROVIDER_SITE_OTHER): Payer: Medicare PPO

## 2022-03-27 VITALS — BP 154/84 | HR 65 | Temp 97.5°F | Resp 16 | Ht 64.0 in | Wt 120.6 lb

## 2022-03-27 DIAGNOSIS — M810 Age-related osteoporosis without current pathological fracture: Secondary | ICD-10-CM

## 2022-03-27 MED ORDER — ROMOSOZUMAB-AQQG 105 MG/1.17ML ~~LOC~~ SOSY
210.0000 mg | PREFILLED_SYRINGE | Freq: Once | SUBCUTANEOUS | Status: AC
  Administered 2022-03-27: 210 mg via SUBCUTANEOUS
  Filled 2022-03-27: qty 2.34

## 2022-03-27 NOTE — Progress Notes (Signed)
Diagnosis: Osteoporosis  Provider:  Marshell Garfinkel MD  Procedure: Injection  Evenity (Romosozumab-aqqg), Dose: 210 mg, Site: subcutaneous, Number of injections: 2  Post Care:  n/a  Discharge: Condition: Good, Destination: Home . AVS provided to patient.   Performed by:  Adelina Mings, LPN

## 2022-04-24 ENCOUNTER — Ambulatory Visit (INDEPENDENT_AMBULATORY_CARE_PROVIDER_SITE_OTHER): Payer: Medicare PPO

## 2022-04-24 VITALS — BP 146/76 | HR 68 | Temp 97.4°F | Resp 18 | Ht 64.0 in | Wt 116.4 lb

## 2022-04-24 DIAGNOSIS — M810 Age-related osteoporosis without current pathological fracture: Secondary | ICD-10-CM

## 2022-04-24 MED ORDER — ROMOSOZUMAB-AQQG 105 MG/1.17ML ~~LOC~~ SOSY
210.0000 mg | PREFILLED_SYRINGE | Freq: Once | SUBCUTANEOUS | Status: AC
  Administered 2022-04-24: 210 mg via SUBCUTANEOUS
  Filled 2022-04-24: qty 2.34

## 2022-04-24 NOTE — Progress Notes (Addendum)
Diagnosis: Osteoporosis  Provider:  Marshell Garfinkel MD  Procedure: Injection  Evenity (Romosozumab-aqqg), Dose: 210 mg, Site: subcutaneous, Number of injections: 2  Post Care: Patient declined observation  Discharge: Condition: Good, Destination: Home . AVS Provided  Performed by:  Jonelle Sidle, RN

## 2022-05-22 ENCOUNTER — Ambulatory Visit (INDEPENDENT_AMBULATORY_CARE_PROVIDER_SITE_OTHER): Payer: Medicare PPO

## 2022-05-22 VITALS — BP 132/74 | HR 69 | Temp 98.2°F | Resp 18 | Ht 64.0 in | Wt 117.2 lb

## 2022-05-22 DIAGNOSIS — M810 Age-related osteoporosis without current pathological fracture: Secondary | ICD-10-CM

## 2022-05-22 MED ORDER — ROMOSOZUMAB-AQQG 105 MG/1.17ML ~~LOC~~ SOSY
210.0000 mg | PREFILLED_SYRINGE | Freq: Once | SUBCUTANEOUS | Status: AC
  Administered 2022-05-22: 210 mg via SUBCUTANEOUS

## 2022-05-22 NOTE — Progress Notes (Signed)
Diagnosis: Osteoporosis  Provider:  Marshell Garfinkel MD  Procedure: Injection  Evenity, Dose: 210 mg, Site: subcutaneous, Number of injections: 2  Post Care: Patient declined observation  Discharge: Condition: Good, Destination: Home . AVS Declined  Performed by:  Cleophus Molt, RN

## 2022-06-20 ENCOUNTER — Other Ambulatory Visit: Payer: Self-pay

## 2022-07-03 ENCOUNTER — Ambulatory Visit (INDEPENDENT_AMBULATORY_CARE_PROVIDER_SITE_OTHER): Payer: Medicare PPO

## 2022-07-03 VITALS — BP 129/73 | HR 72 | Temp 97.9°F | Resp 18

## 2022-07-03 DIAGNOSIS — M810 Age-related osteoporosis without current pathological fracture: Secondary | ICD-10-CM

## 2022-07-03 MED ORDER — DENOSUMAB 60 MG/ML ~~LOC~~ SOSY
60.0000 mg | PREFILLED_SYRINGE | Freq: Once | SUBCUTANEOUS | Status: AC
  Administered 2022-07-03: 60 mg via SUBCUTANEOUS
  Filled 2022-07-03: qty 1

## 2022-07-03 NOTE — Progress Notes (Signed)
Diagnosis: Osteoporosis  Provider:  Praveen Mannam MD  Procedure: Injection  Prolia (Denosumab), Dose: 60 mg, Site: subcutaneous, Number of injections: 1  Post Care: Observation period completed  Discharge: Condition: Good, Destination: Home . AVS Provided  Performed by:  Takeela Peil, RN       

## 2022-10-22 NOTE — Progress Notes (Signed)
Diana Potts, female    DOB: 09/07/1940   MRN: [id number]   Brief patient profile:  82  yowf quit smoking 1990s with hx of breathing problems in her  30s/40's (around 1980) eval by allergist in [place] on shots no better  improved on Symbicort self - referred to pulmonary clinic 10/23/2022 for GOLD COPD/AB GOLD 2 criteria in 07/2020 pfts    History of Present Illness  10/23/2022  Pulmonary/ 1st office eval/[facility] maint on symbicort / singulair  Chief Complaint  Patient presents with   Consult    Dx asthma and COPD.  SOB with exertion.  Dyspnea:  variable thinks breathing problems related to sinus congestion x sev months and relatively sedentary   Cough: none now but convinced it's usually due  to contaminants in her home environment Sleep: no problem flat with one pillow  SABA use: none   No obvious day to day or daytime pattern/variability or assoc excess/ purulent sputum or mucus plugs or hemoptysis or cp or chest tightness, subjective wheeze or overt sinus or hb symptoms.    . Also denies any obvious fluctuation of symptoms with weather or environmental changes or other aggravating or alleviating factors except as outlined above   No unusual exposure hx or h/o childhood pna/ asthma or knowledge of premature birth.  Current Allergies, Complete Past Medical History, Past Surgical History, Family History, and Social History were reviewed in [employer] record.  ROS  The following are not active complaints unless bolded Hoarseness, sore throat, dysphagia, dental problems, itching, sneezing,  nasal congestion or discharge of excess mucus or purulent secretions, ear ache,   fever, chills, sweats, unintended wt loss or wt gain, classically pleuritic or exertional cp,  orthopnea pnd or arm/hand swelling  or leg swelling, presyncope, palpitations, abdominal pain, anorexia, nausea, vomiting, diarrhea  or change in bowel habits or change in bladder habits, change in stools  or change in urine, dysuria, hematuria,  rash, arthralgias, visual complaints, headache, numbness, weakness or ataxia or problems with walking or coordination,  change in mood or  memory.             Outpatient Medications Prior to Visit  Medication Sig Dispense Refill   albuterol (VENTOLIN HFA) 108 (90 Base) MCG/ACT inhaler Inhale 1 puff into the lungs every 6 (six) hours as needed for wheezing or shortness of breath.     bevacizumab (AVASTIN) 2.5 mg/0.1 mL SOLN 2.5 mg by Intravitreal route once. Every 4-5 weeks     Co-Enzyme Q-10 100 MG CAPS Take 100 mg by mouth daily.     denosumab (PROLIA) 60 MG/ML SOSY injection Inject 60 mg into the skin every 6 (six) months.     dorzolamide-timolol (COSOPT) 22.3-6.8 MG/ML ophthalmic solution Place 1 drop into both eyes 2 (two) times daily.     Estradiol-Progesterone 1-100 MG CAPS Take 1 capsule by mouth daily.     FLUoxetine (PROZAC) 10 MG capsule Take 10 mg by mouth at bedtime.     fluticasone (FLONASE) 50 MCG/ACT nasal spray Place 2 sprays into both nostrils daily as needed for allergies.     latanoprost (XALATAN) 0.005 % ophthalmic solution Place 1 drop into both eyes at bedtime.     LORazepam (ATIVAN) 0.5 MG tablet Take 0.25 mg by mouth 2 (two) times daily as needed for anxiety or sleep.     montelukast (SINGULAIR) 10 MG tablet Take 10 mg by mouth at bedtime.     phenylephrine (NEO-SYNEPHRINE) 1 %  nasal spray Place 1 drop into both nostrils every 6 (six) hours as needed for congestion.     SYMBICORT 160-4.5 MCG/ACT inhaler Inhale 2 puffs into the lungs 2 (two) times daily.     ALPHA-LIPOIC ACID PO Take 2 capsules by mouth daily. (Patient not taking: Reported on 10/23/2022)     Cholecalciferol (VITAMIN D3 PO) Take 2,000-3,000 Units by mouth daily. Vitamin D3 Liquid (Patient not taking: Reported on 10/23/2022)     COLLAGEN PO Take 3 capsules by mouth daily. (Patient not taking: Reported on 10/23/2022)     Digestive Enzyme CAPS Take 1 capsule by mouth  daily. (Patient not taking: Reported on 10/23/2022)     pantoprazole (PROTONIX) 40 MG tablet Take 1 tablet (40 mg total) by mouth 2 (two) times daily. (Patient not taking: Reported on 10/23/2022)     No facility-administered medications prior to visit.    No past medical history on file.    Objective:     BP 122/70 (BP Location: Left Arm, Patient Position: Sitting, Cuff Size: Normal)   Pulse 100   Temp 98 F (36.7 C) (Oral)   Ht 5\' 4"  (1.626 m)   Wt 118 lb 12.8 oz (53.9 kg)   SpO2 97%   BMI 20.39 kg/m   SpO2: 97 % pleasant thin somber wf nad    HEENT : Oropharynx  clear   Nasal turbinates nl    NECK :  without  apparent JVD/ palpable Nodes/TM    LUNGS: no acc muscle use,  Min barrel  contour chest wall with bilateral  slightly decreased bs s audible wheeze and  without cough on insp or exp maneuvers and min  Hyperresonant  to  percussion bilaterally    CV:  RRR  no s3 or murmur or increase in P2, and no edema   ABD:  soft and nontender with pos end  insp Hoover's  in the supine position.  No bruits or organomegaly appreciated   MS:  Nl gait/ ext warm without deformities Or obvious joint restrictions  calf tenderness, cyanosis or clubbing     SKIN: warm and dry without lesions    NEURO:  alert, approp, nl sensorium with  no motor or cerebellar deficits apparent.              Assessment   COPD GOLD 2 Quit smoking 1990s - 10/23/2022  After extensive coaching inhaler device,  effectiveness =  75% so continue symbicort 160 2bid  - 10/23/2022   Walked on RA  x  3  lap(s) =  approx 750  ft  @ mod pace, stopped due to end of study with lowest 02 sats 95% s sob    Symptoms are disproportionate to objective findings and not clear to what extent this is actually a pulmonary  problem but pt does appear to have difficult to sort out respiratory symptoms of unknown origin for which  DDX  = almost all start with A and  include Adherence, Ace Inhibitors, Acid Reflux, Active Sinus  Disease, Alpha 1 Antitripsin deficiency, Anxiety masquerading as Airways dz,  ABPA,  Allergy(esp in young), Aspiration (esp in elderly), Adverse effects of meds,  Active smoking or Vaping, A bunch of PE's/clot burden (a few small clots can't cause this syndrome unless there is already severe underlying pulm or vascular dz with poor reserve),  Anemia or thyroid disorder, plus two Bs  = Bronchiectasis and Beta blocker use..and one C= CHF    Adherence is always the initial "  prime suspect" and is a multilayered concern that requires a "trust but verify" approach in every patient - starting with knowing how to use medications, especially inhalers, correctly, keeping up with refills and understanding the fundamental difference between maintenance and prns vs those medications only taken for a very short course and then stopped and not refilled.  -see hfa teaching  - return with all meds in hand using a trust but verify approach to confirm accurate Medication  Reconciliation The principal here is that until we are certain that the  patients are doing what we've asked, it makes no sense to ask them to do more.   ? Acid (or non-acid) GERD > always difficult to exclude as up to 75% of pts in some series report no assoc GI/ Heartburn symptoms> rec max (24h)  acid suppression and diet restrictions/ reviewed and instructions given in writing.   ? Allergy /asthma component > high dose ics for now/ Prednisone 10 mg take  4 each am x 2 days,   2 each am x 2 days,  1 each am x 2 days and stop and f/u with cbc with diff next ov   ? Alpha one AT > needs to have done next ov to complete the w/u   ? Anxiety/ deconditioning   usually at the bottom of this list of usual suspects but  may interfere with adherence and also interpretation of response or lack thereof to symptom management which can be quite subjective.   ? Active sinus dz > check sinus ct rather than empirical ab    F/u in 8 weeks with pfts/ call sooner if  needed with pfts on return   Each maintenance medication was reviewed in detail including emphasizing most importantly the difference between maintenance and prns and under what circumstances the prns are to be triggered using an action plan format where appropriate.  Total time for H and P, chart review, counseling, reviewing hfa device(s) , directly observing portions of ambulatory 02 saturation study/ and generating customized AVS unique to this office visit / same day charting = 48 min new pt eval                     Sandrea Hughs, MD 10/23/2022

## 2022-10-23 ENCOUNTER — Ambulatory Visit: Payer: Medicare PPO | Admitting: Internal Medicine

## 2022-10-23 ENCOUNTER — Encounter: Payer: Self-pay | Admitting: Internal Medicine

## 2022-10-23 VITALS — BP 122/70 | HR 100 | Temp 98.0°F | Ht 64.0 in | Wt 118.8 lb

## 2022-10-23 DIAGNOSIS — J449 Chronic obstructive pulmonary disease, unspecified: Secondary | ICD-10-CM

## 2022-10-23 MED ORDER — FAMOTIDINE 20 MG PO TABS
ORAL_TABLET | ORAL | 11 refills | Status: DC
Start: 2022-10-23 — End: 2023-01-05

## 2022-10-23 MED ORDER — PANTOPRAZOLE SODIUM 40 MG PO TBEC: 40.0000 mg | DELAYED_RELEASE_TABLET | Freq: Every day | ORAL | 2 refills | Status: AC

## 2022-10-23 NOTE — Patient Instructions (Addendum)
Pantoprazole (protonix) 40 mg   Take  30-60 min before first meal of the day and Pepcid (famotidine)  20 mg after supper until return to office - this is the best way to tell whether stomach acid is contributing to your problem.    GERD (REFLUX)  is an extremely common cause of respiratory symptoms just like yours , many times with no obvious heartburn at all.    It can be treated with medication, but also with lifestyle changes including elevation of the head of your bed (ideally with 6 -8inch blocks under the headboard of your bed),  Smoking cessation, avoidance of late meals, excessive alcohol, and avoid fatty foods, chocolate, peppermint, colas, red wine, and acidic juices such as orange juice.  NO MINT OR MENTHOL PRODUCTS SO NO COUGH DROPS  USE SUGARLESS CANDY INSTEAD (Jolley ranchers or Stover's or Life Savers) or even ice chips will also do - the key is to swallow to prevent all throat clearing. NO OIL BASED VITAMINS - use powdered substitutes.  Avoid fish oil when coughing.   Plan A = Automatic = Always=    Symbicort 80 Take 2 puffs first thing in am and then another 2 puffs about 12 hours later.    Work on inhaler technique:  relax and gently blow all the way out then take a nice smooth full deep breath back in, triggering the inhaler at same time you start breathing in.  Hold breath in for at least  5 seconds if you can. Blow out Symbicort  thru nose. Rinse and gargle with water when done.  If mouth or throat bother you at all,  try brushing teeth/gums/tongue with arm and hammer toothpaste/ make a slurry and gargle and spit out.      Plan B = Backup (to supplement plan A, not to replace it) Only use your albuterol inhaler as a rescue medication to be used if you can't catch your breath by resting or doing a relaxed purse lip breathing pattern.  - The less you use it, the better it will work when you need it. - Ok to use the inhaler up to 2 puffs  every 4 hours if you must but call for  appointment if use goes up over your usual need - Don't leave home without it !!  (think of it like the spare tire for your car)     Prednisone 10 mg take  4 each am x 2 days,   2 each am x 2 days,  1 each am x 2 days and stop   My office will be contacting you by phone for referral to sinus CT   - if you don't hear back from my office within one week please call us back or notify us thru MyChart and we'll address it right away.    Please schedule a follow up office visit in 8 weeks, sooner if needed  with all medications /inhalers/ solutions in hand so we can verify exactly what you are taking. This includes all medications from all doctors and over the counters - PLEASE separate them into two bags:  the ones you take automatically, no matter what, vs the ones you take just when you feel you need them "BAG #2 is UP TO YOU"  - this will really help [place] help you take your medications more effectively.

## 2022-10-24 ENCOUNTER — Telehealth: Payer: Self-pay | Admitting: Internal Medicine

## 2022-10-24 NOTE — Assessment & Plan Note (Addendum)
Quit smoking 1990s - 10/23/2022  After extensive coaching inhaler device,  effectiveness =  75% so continue symbicort 160 2bid  - 10/23/2022   Walked on RA  x  3  lap(s) =  approx 750  ft  @ mod pace, stopped due to end of study with lowest 02 sats 95% s sob    Symptoms are disproportionate to objective findings and not clear to what extent this is actually a pulmonary  problem but pt does appear to have difficult to sort out respiratory symptoms of unknown origin for which  DDX  = almost all start with A and  include Adherence, Ace Inhibitors, Acid Reflux, Active Sinus Disease, Alpha 1 Antitripsin deficiency, Anxiety masquerading as Airways dz,  ABPA,  Allergy(esp in young), Aspiration (esp in elderly), Adverse effects of meds,  Active smoking or Vaping, A bunch of PE's/clot burden (a few small clots can't cause this syndrome unless there is already severe underlying pulm or vascular dz with poor reserve),  Anemia or thyroid disorder, plus two Bs  = Bronchiectasis and Beta blocker use..and one C= CHF    Adherence is always the initial "prime suspect" and is a multilayered concern that requires a "trust but verify" approach in every patient - starting with knowing how to use medications, especially inhalers, correctly, keeping up with refills and understanding the fundamental difference between maintenance and prns vs those medications only taken for a very short course and then stopped and not refilled.  -see hfa teaching  - return with all meds in hand using a trust but verify approach to confirm accurate Medication  Reconciliation The principal here is that until we are certain that the  patients are doing what we've asked, it makes no sense to ask them to do more.   ? Acid (or non-acid) GERD > always difficult to exclude as up to 75% of pts in some series report no assoc GI/ Heartburn symptoms> rec max (24h)  acid suppression and diet restrictions/ reviewed and instructions given in writing.   ?  Allergy /asthma component > high dose ics for now/ Prednisone 10 mg take  4 each am x 2 days,   2 each am x 2 days,  1 each am x 2 days and stop and f/u with cbc with diff next ov   ? Alpha one AT > needs to have done next ov to complete the w/u   ? Anxiety/ deconditioning   usually at the bottom of this list of usual suspects but  may interfere with adherence and also interpretation of response or lack thereof to symptom management which can be quite subjective.   ? Active sinus dz > check sinus ct rather than empirical ab    F/u in 8 weeks with pfts/ call sooner if needed with pfts on return   Each maintenance medication was reviewed in detail including emphasizing most importantly the difference between maintenance and prns and under what circumstances the prns are to be triggered using an action plan format where appropriate.  Total time for H and P, chart review, counseling, reviewing hfa device(s) , directly observing portions of ambulatory 02 saturation study/ and generating customized AVS unique to this office visit / same day charting = 48 min new pt eval

## 2022-10-24 NOTE — Telephone Encounter (Signed)
PT calling saying CT scan not set on a good day for her. Please call @ [phone]

## 2022-10-24 NOTE — Telephone Encounter (Signed)
Patient calling sating she was in office yesterday and received her medication from her pharmacy but she was not prescribe her prednisone.

## 2022-10-27 ENCOUNTER — Other Ambulatory Visit: Payer: Self-pay

## 2022-10-27 MED ORDER — PREDNISONE 10 MG PO TABS: ORAL_TABLET | ORAL | 0 refills | Status: DC

## 2022-10-27 NOTE — Telephone Encounter (Signed)
Prednisone has been sent to patients pharmacy. She is aware. nfn

## 2022-10-31 ENCOUNTER — Other Ambulatory Visit (HOSPITAL_BASED_OUTPATIENT_CLINIC_OR_DEPARTMENT_OTHER): Payer: Medicare PPO

## 2022-11-07 ENCOUNTER — Ambulatory Visit (HOSPITAL_BASED_OUTPATIENT_CLINIC_OR_DEPARTMENT_OTHER)
Admission: RE | Admit: 2022-11-07 | Discharge: 2022-11-07 | Disposition: A | Discharge status: Home / Self Care | Payer: Medicare PPO | Source: Ambulatory Visit | Attending: Internal Medicine | Admitting: Internal Medicine

## 2022-11-07 DIAGNOSIS — J449 Chronic obstructive pulmonary disease, unspecified: Secondary | ICD-10-CM | POA: Diagnosis present

## 2022-11-25 ENCOUNTER — Encounter: Payer: Self-pay | Admitting: Internal Medicine

## 2022-11-25 DIAGNOSIS — J329 Chronic sinusitis, unspecified: Secondary | ICD-10-CM | POA: Insufficient documentation

## 2022-11-28 ENCOUNTER — Telehealth: Payer: Self-pay | Admitting: Internal Medicine

## 2022-11-28 NOTE — Telephone Encounter (Signed)
Patient is trying to return call from Amy in regards to her CT Scan results. [phone]

## 2022-12-01 ENCOUNTER — Telehealth: Payer: Self-pay

## 2022-12-01 NOTE — Telephone Encounter (Signed)
Called patient.  Gave all information.  Patient verbalized understanding.

## 2022-12-01 NOTE — Telephone Encounter (Signed)
Per your note on 10/23/2022 patients last OV:  F/u in 8 weeks with pfts/ call sooner if needed with pfts on return   I do not see this on patient AVS.  Patient wants to know if you want her to schedule PFT's at her return visit with you on 01/05/2023.  I don't see where this has been scheduled with her OV.  Please advise.  Thank you.

## 2022-12-03 NOTE — Telephone Encounter (Addendum)
Will schedule patient for next available PFT and OV with Dr. Sherene Sires.  Will put in orders only in EPIC.  Dr. Sherene Sires:  Do you want a full PFT or spiro/DLCO?  Please advise.

## 2022-12-03 NOTE — Telephone Encounter (Signed)
Full pft

## 2022-12-04 ENCOUNTER — Other Ambulatory Visit: Payer: Self-pay

## 2022-12-04 DIAGNOSIS — J449 Chronic obstructive pulmonary disease, unspecified: Secondary | ICD-10-CM

## 2022-12-04 NOTE — Progress Notes (Signed)
Orders only per Dr. Sherene Sires for full PFT.  See TC encounter from 12/01/2022.

## 2022-12-05 NOTE — Telephone Encounter (Signed)
Scheduled full PFT.  Done.  Patient notified per Ray at front desk.

## 2023-01-02 ENCOUNTER — Ambulatory Visit (INDEPENDENT_AMBULATORY_CARE_PROVIDER_SITE_OTHER): Payer: Medicare PPO

## 2023-01-02 VITALS — BP 155/70 | HR 72 | Temp 98.5°F | Resp 16 | Ht 64.0 in | Wt 119.2 lb

## 2023-01-02 DIAGNOSIS — M810 Age-related osteoporosis without current pathological fracture: Secondary | ICD-10-CM | POA: Diagnosis not present

## 2023-01-02 MED ORDER — DENOSUMAB 60 MG/ML ~~LOC~~ SOSY
60.0000 mg | PREFILLED_SYRINGE | Freq: Once | SUBCUTANEOUS | Status: AC
  Administered 2023-01-02: 60 mg via SUBCUTANEOUS
  Filled 2023-01-02: qty 1

## 2023-01-02 NOTE — Progress Notes (Signed)
Diagnosis: Osteoporosis  Provider:  Chilton Greathouse MD  Procedure: Injection  Prolia (Denosumab), Dose: 60 mg, Site: subcutaneous, Number of injections: 1  Post Care: Patient declined observation  Discharge: Condition: Good, Destination: Home . AVS Provided  Performed by:  Loney Hering, LPN

## 2023-01-05 ENCOUNTER — Encounter: Payer: Self-pay | Admitting: Internal Medicine

## 2023-01-05 ENCOUNTER — Ambulatory Visit: Payer: Medicare PPO | Admitting: Internal Medicine

## 2023-01-05 VITALS — BP 158/74 | HR 67 | Temp 98.0°F | Ht 64.0 in | Wt 119.2 lb

## 2023-01-05 DIAGNOSIS — J449 Chronic obstructive pulmonary disease, unspecified: Secondary | ICD-10-CM | POA: Diagnosis not present

## 2023-01-05 DIAGNOSIS — J329 Chronic sinusitis, unspecified: Secondary | ICD-10-CM | POA: Diagnosis not present

## 2023-01-05 MED ORDER — PREDNISONE 10 MG PO TABS: ORAL_TABLET | ORAL | 0 refills | Status: DC

## 2023-01-05 MED ORDER — BUDESONIDE-FORMOTEROL FUMARATE 80-4.5 MCG/ACT IN AERO: INHALATION_SPRAY | RESPIRATORY_TRACT | 12 refills | Status: DC

## 2023-01-05 MED ORDER — FLUTICASONE PROPIONATE 50 MCG/ACT NA SUSP: NASAL | 2 refills | Status: DC

## 2023-01-05 NOTE — Patient Instructions (Addendum)
Try Symbicort 80  Take 2 puffs first thing in am and then another 2 puffs about 12 hours later.   Work on inhaler technique:  relax and gently blow all the way out then take a nice smooth full deep breath back in, triggering the inhaler at same time you start breathing in.  Hold breath in for at least  5 seconds if you can. Blow out symbicort  thru nose. Rinse and gargle with water when done.  If mouth or throat bother you at all,  try brushing teeth/gums/tongue with arm and hammer toothpaste/ make a slurry and gargle and spit out.      Prednisone 10 mg take  4 each am x 2 days,   2 each am x 2 days,  1 each am x 2 days and stop   My office will be contacting you by phone for referral to [facility]   - if you don't hear back from my office within one week please call us back or notify us thru MyChart and we'll address it right away.   Please schedule a follow up visit in 6 months but call sooner if needed

## 2023-01-05 NOTE — Assessment & Plan Note (Addendum)
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  Total time for H and P, chart review, counseling, reviewing hfa  device(s) and generating customized AVS unique to this office visit / same day charting > 30 min for multiple  refractory respiratory  symptoms of uncertain etiology

## 2023-01-05 NOTE — Assessment & Plan Note (Signed)
Quit smoking 1990s - allergy testing in 20s and 40s made no difference - 10/23/2022  After extensive coaching inhaler device,  effectiveness =  75% so continue symbicort 160 2bid  - 10/23/2022   Walked on RA  x  3  lap(s) =  approx 750  ft  @ mod pace, stopped due to end of study with lowest 02 sats 95% s sob    >>> 01/05/2023 reduce symbicort to the 80 strength  to see if helps lump in throat s compromising lower airway control   01/05/2023  After extensive coaching inhaler device,  effectiveness =    75%   Add pred x 6 day taper to see if repeat the resolution of the "lump in throat" while on max gerd rx and whether it stays gone on low dose symbicort  and if not > ent eval next step

## 2023-01-05 NOTE — Progress Notes (Signed)
Diana Potts, female    DOB: Sep 28, 1940   MRN: [id number]   Brief patient profile:  82  yowf quit smoking 1990s with hx of breathing problems in her  30s/40's (around 1980) eval by allergist in [place] on shots no better  improved on Symbicort self - referred to pulmonary clinic 10/23/2022 for GOLD COPD/AB GOLD 2 criteria in 07/2020 pfts    History of Present Illness  10/23/2022  Pulmonary/ 1st office eval/Diana Potts maint on symbicort / singulair  Chief Complaint  Patient presents with   Consult    Dx asthma and COPD.  SOB with exertion.  Dyspnea:  variable thinks breathing problems related to sinus congestion x sev months and relatively sedentary   Cough: none now but convinced it's usually due  to contaminants in her home environment Sleep: no problem flat with one pillow  SABA use: none  Rec Pantoprazole (protonix) 40 mg   Take  30-60 min before first meal of the day and Pepcid (famotidine)  20 mg after supper until return to office - this is the best way to tell whether stomach acid is contributing to your problem.   GERD diet reviewed, bed blocks rec  Plan A = Automatic = Always=    Symbicort 120 Take 2 puffs first thing in am and then another 2 puffs about 12 hours later.  Work on inhaler technique:  r  Plan B = Backup (to supplement plan A, not to replace it) Only use your albuterol inhaler as a rescue medication  Prednisone 10 mg take  4 each am x 2 days,   2 each am x 2 days,  1 each am x 2 days and stop  Please schedule a follow up office visit in 8 weeks, sooner if needed  with all medications /inhalers/ solutions in hand   CT sinus  11/06/21 Mild-to-moderate paranasal sinus disease (no air fluid levels)    01/05/2023  f/u ov/Diana Potts re: GOLD 2 copd  maint on symb 160  did not  bring meds Chief Complaint  Patient presents with   Follow-up    Patient wants to discuss her scan today   Dyspnea:  MMRC1 = can walk nl pace, flat grade, can't hurry or go uphills or steps s sob    Cough: has sense of globus ? " A lot better p prednisone, why can't I just stay on it"  Sleeping: 30 degrees/ s  resp cc occ waking up and using saba noct   SABA use: using around meals if feels getting choked  02: none     No obvious day to day or daytime variability or assoc excess/ purulent sputum or mucus plugs or hemoptysis or cp or chest tightness, subjective wheeze or overt sinus or hb symptoms.    Also denies any obvious fluctuation of symptoms with weather or environmental changes or other aggravating or alleviating factors except as outlined above   No unusual exposure hx or h/o childhood pna/ asthma or knowledge of premature birth.  Current Allergies, Complete Past Medical History, Past Surgical History, Family History, and Social History were reviewed in [employer] record.  ROS  The following are not active complaints unless bolded Hoarseness, sore throat(globus)  dysphagia, dental problems, itching, sneezing,  nasal congestion or discharge of excess mucus or purulent secretions, ear ache,   fever, chills, sweats, unintended wt loss or wt gain, classically pleuritic or exertional cp,  orthopnea pnd or arm/hand swelling  or leg swelling, presyncope, palpitations,  abdominal pain, anorexia, nausea, vomiting, diarrhea  or change in bowel habits or change in bladder habits, change in stools or change in urine, dysuria, hematuria,  rash, arthralgias, visual complaints, headache, numbness, weakness or ataxia or problems with walking or coordination,  change in mood or  memory.        Current Meds  Medication Sig   albuterol (VENTOLIN HFA) 108 (90 Base) MCG/ACT inhaler Inhale 1 puff into the lungs every 6 (six) hours as needed for wheezing or shortness of breath.   bevacizumab (AVASTIN) 2.5 mg/0.1 mL SOLN 2.5 mg by Intravitreal route once. Every 4-5 weeks   budesonide-formoterol (SYMBICORT) 80-4.5 MCG/ACT inhaler Take 2 puffs first thing in am and then another 2  puffs about 12 hours later.   Co-Enzyme Q-10 100 MG CAPS Take 100 mg by mouth daily.   denosumab (PROLIA) 60 MG/ML SOSY injection Inject 60 mg into the skin every 6 (six) months.   dorzolamide-timolol (COSOPT) 22.3-6.8 MG/ML ophthalmic solution Place 1 drop into both eyes 2 (two) times daily.   Estradiol-Progesterone 1-100 MG CAPS Take 1 capsule by mouth daily.   FLUoxetine (PROZAC) 10 MG capsule Take 10 mg by mouth at bedtime.   fluticasone (FLONASE) 50 MCG/ACT nasal spray Two sprays twice daily each nostil   latanoprost (XALATAN) 0.005 % ophthalmic solution Place 1 drop into both eyes at bedtime.   LORazepam (ATIVAN) 0.5 MG tablet Take 0.25 mg by mouth 2 (two) times daily as needed for anxiety or sleep.   montelukast (SINGULAIR) 10 MG tablet Take 10 mg by mouth at bedtime.   pantoprazole (PROTONIX) 40 MG tablet Take 1 tablet (40 mg total) by mouth daily. Take 30-60 min before first meal of the day   phenylephrine (NEO-SYNEPHRINE) 1 % nasal spray Place 1 drop into both nostrils every 6 (six) hours as needed for congestion.   [DISCONTINUED] famotidine (PEPCID) 20 MG tablet One after supper   [DISCONTINUED] fluticasone (FLONASE) 50 MCG/ACT nasal spray Place 2 sprays into both nostrils daily as needed for allergies.   [DISCONTINUED] predniSONE (DELTASONE) 10 MG tablet Take 4 tabs x 2 days, 2 tabs x 2 days, then 1 tab x 2 days and stop.   [DISCONTINUED] SYMBICORT 160-4.5 MCG/ACT inhaler Inhale 2 puffs into the lungs 2 (two) times daily.           Objective:    wts  01/05/2023     119    01/02/23 119 lb 3.2 oz (54.1 kg)  10/23/22 118 lb 12.8 oz (53.9 kg)  05/22/22 117 lb 3.2 oz (53.2 kg)      Vital signs reviewed  01/05/2023  - Note at rest 02 sats  95% on RA   General appearance:    amb wf nad    HEENT : Oropharynx  clear    NECK :  without  apparent JVD/ palpable Nodes/TM    LUNGS: no acc muscle use,  Min barrel  contour chest wall with bilateral  slightly decreased bs s  audible wheeze and  without cough on insp or exp maneuvers and min  Hyperresonant  to  percussion bilaterally    CV:  RRR  no s3 or murmur or increase in P2, and no edema   ABD:  soft and nontender with pos end  insp Hoover's  in the supine position.  No bruits or organomegaly appreciated   MS:  Nl gait/ ext warm without deformities Or obvious joint restrictions  calf tenderness, cyanosis or clubbing  SKIN: warm and dry without lesions    NEURO:  alert, approp, nl sensorium with  no motor or cerebellar deficits apparent.             Assessment

## 2023-01-09 ENCOUNTER — Encounter (INDEPENDENT_AMBULATORY_CARE_PROVIDER_SITE_OTHER): Payer: Self-pay | Admitting: Otolaryngology

## 2023-01-12 ENCOUNTER — Telehealth: Payer: Self-pay | Admitting: Pharmacy Technician

## 2023-01-12 NOTE — Telephone Encounter (Signed)
Auth Submission: APPROVED Site of care: Site of care: [facility] Payer: [employer] MEDICARE Medication & CPT/J Code(s) submitted: Prolia (Denosumab) [id number] Route of submission (phone, fax, portal):  Phone # Fax # Auth type: Buy/Bill PB Units/visits requested: 60MG  Q6 MONTHS X2 DOSES Reference number: [id number] Approval from: 03/11/23 to 03/09/24

## 2023-01-21 ENCOUNTER — Ambulatory Visit: Payer: Medicare PPO | Admitting: Internal Medicine

## 2023-01-21 DIAGNOSIS — J449 Chronic obstructive pulmonary disease, unspecified: Secondary | ICD-10-CM | POA: Diagnosis not present

## 2023-01-21 LAB — PULMONARY FUNCTION TEST
FEF 25-75 Pre: 0.58 L/s
FEF2575-%Pred-Pre: 42 %
FEV1-%Pred-Pre: 62 %
FEV1-Pre: 1.19 L
FEV1FVC-%Pred-Pre: 72 %
FEV6-%Pred-Pre: 91 %
FEV6-Pre: 2.21 L
FEV6FVC-%Pred-Pre: 105 %
FVC-%Pred-Pre: 86 %
FVC-Pre: 2.23 L
Pre FEV1/FVC ratio: 53 %
Pre FEV6/FVC Ratio: 100 %

## 2023-01-21 NOTE — Progress Notes (Signed)
Spirometry only performed. Patient was not able to tolerate nose clip, she says it was causing ear pain and asked to d/c test.

## 2023-03-31 ENCOUNTER — Telehealth: Payer: Self-pay | Admitting: Internal Medicine

## 2023-03-31 MED ORDER — AZITHROMYCIN 250 MG PO TABS: 250.0000 mg | ORAL_TABLET | ORAL | 0 refills | Status: DC

## 2023-03-31 MED ORDER — PREDNISONE 10 MG PO TABS: ORAL_TABLET | ORAL | 0 refills | Status: DC

## 2023-03-31 NOTE — Telephone Encounter (Signed)
I called and spoke with the pt  She is c/o increased cough with moderate yellow sputum x 3 days  She has had some increased DOE  She states this is how her typical flare comes on  She denies any fevers, aches  She declined ov that I offered her for 1/23 and says that it's too cold for her to go out  She is taking her symbicort 2 puffs bid  Please advise, thanks!  No Known Allergies

## 2023-03-31 NOTE — Telephone Encounter (Signed)
Nyoka Cowden, MD to Me     03/31/23  3:19 PM  Prednisone 10 mg take  4 each am x 2 days,   2 each am x 2 days,  1 each am x 2 days and stop Zpak  Ov by end of week if not improving    I called and spoke with the pt and notified of response per Dr Sherene Sires  She verbalized understanding  Nothing further needed

## 2023-03-31 NOTE — Telephone Encounter (Signed)
Patient states having symptoms of cough, mucus and shortness of breath. Patient declined appointment at this time. Pharmacy is [facility]. Patient phone number is [phone].

## 2023-04-06 ENCOUNTER — Telehealth: Payer: Self-pay | Admitting: Internal Medicine

## 2023-04-06 NOTE — Telephone Encounter (Signed)
Patient needs albuterol in halation solution.  Pharmacy: [facility] in [place]

## 2023-04-08 ENCOUNTER — Encounter: Payer: Self-pay | Admitting: Pulmonary Disease

## 2023-04-08 ENCOUNTER — Ambulatory Visit: Payer: Medicare PPO | Admitting: Pulmonary Disease

## 2023-04-08 VITALS — BP 153/80 | HR 76 | Temp 97.5°F | Ht 65.0 in | Wt 115.4 lb

## 2023-04-08 DIAGNOSIS — J441 Chronic obstructive pulmonary disease with (acute) exacerbation: Secondary | ICD-10-CM

## 2023-04-08 DIAGNOSIS — J42 Unspecified chronic bronchitis: Secondary | ICD-10-CM

## 2023-04-08 MED ORDER — ALBUTEROL SULFATE (2.5 MG/3ML) 0.083% IN NEBU: 2.5000 mg | INHALATION_SOLUTION | Freq: Four times a day (QID) | RESPIRATORY_TRACT | 12 refills | Status: AC | PRN

## 2023-04-08 MED ORDER — PREDNISONE 20 MG PO TABS: 20.0000 mg | ORAL_TABLET | Freq: Every day | ORAL | 0 refills | Status: DC

## 2023-04-08 NOTE — Telephone Encounter (Signed)
Rx for albuterol neb sol already sent  I spoke with the pt and notified this was done  Nothing further needed

## 2023-04-08 NOTE — Patient Instructions (Signed)
Prescription for prednisone will be sent to pharmacy for you  Medications for the nebulizer will also be sent to pharmacy  We will have an order on the record for chest x-ray whenever you feel like it  Call us with significant concerns  Keep your next appointment with Dr. Sherene Sires

## 2023-04-08 NOTE — Progress Notes (Signed)
Diana Potts    [id number]    1940/11/02  Primary Care Physician:Jai Scherrie Bateman, NP  Referring Physician: Micki Riley, Harlin Rain, NP [address] [place],  [place] [address]  Chief complaint:   Seen for shortness of breath  HPI:  Recently completed a course of antibiotics  Usually gets an exacerbation of bronchitis about once a year  She does have a background history of COPD, follows up with Dr. Octavio Graves on a regular basis  Has had no fevers, no chills, no significant musculoskeletal pain or discomfort The coughing is not keeping her up at night Outpatient Encounter Medications as of 04/08/2023  Medication Sig   albuterol (PROVENTIL) (2.5 MG/3ML) 0.083% nebulizer solution Take 3 mLs (2.5 mg total) by nebulization every 6 (six) hours as needed for wheezing or shortness of breath.   albuterol (VENTOLIN HFA) 108 (90 Base) MCG/ACT inhaler Inhale 1 puff into the lungs every 6 (six) hours as needed for wheezing or shortness of breath.   azithromycin (ZITHROMAX) 250 MG tablet Take 1 tablet (250 mg total) by mouth as directed. 2 today, then 1 daily until gone   bevacizumab (AVASTIN) 2.5 mg/0.1 mL SOLN 2.5 mg by Intravitreal route once. Every 4-5 weeks   budesonide-formoterol (SYMBICORT) 80-4.5 MCG/ACT inhaler Take 2 puffs first thing in am and then another 2 puffs about 12 hours later.   Co-Enzyme Q-10 100 MG CAPS Take 100 mg by mouth daily.   denosumab (PROLIA) 60 MG/ML SOSY injection Inject 60 mg into the skin every 6 (six) months.   dorzolamide-timolol (COSOPT) 22.3-6.8 MG/ML ophthalmic solution Place 1 drop into both eyes 2 (two) times daily.   Estradiol-Progesterone 1-100 MG CAPS Take 1 capsule by mouth daily.   FLUoxetine (PROZAC) 10 MG capsule Take 10 mg by mouth at bedtime.   fluticasone (FLONASE) 50 MCG/ACT nasal spray Two sprays twice daily each nostil   latanoprost (XALATAN) 0.005 % ophthalmic solution Place 1 drop into both eyes at bedtime.   LORazepam  (ATIVAN) 0.5 MG tablet Take 0.25 mg by mouth 2 (two) times daily as needed for anxiety or sleep.   montelukast (SINGULAIR) 10 MG tablet Take 10 mg by mouth at bedtime.   pantoprazole (PROTONIX) 40 MG tablet Take 1 tablet (40 mg total) by mouth daily. Take 30-60 min before first meal of the day   phenylephrine (NEO-SYNEPHRINE) 1 % nasal spray Place 1 drop into both nostrils every 6 (six) hours as needed for congestion.   predniSONE (DELTASONE) 10 MG tablet Take 4 tabs x 2 days, 2 tabs x 2 days, then 1 tab x 2 days and stop.   predniSONE (DELTASONE) 10 MG tablet 4 x 2 days, 2 x 2 days, 1 x 2 days then stop   predniSONE (DELTASONE) 20 MG tablet Take 1 tablet (20 mg total) by mouth daily with breakfast.   No facility-administered encounter medications on file as of 04/08/2023.    Allergies as of 04/08/2023   (No Known Allergies)    No past medical history on file.  Past Surgical History:  Procedure Laterality Date   INTRAMEDULLARY (IM) NAIL INTERTROCHANTERIC Left 09/24/2019   Procedure: INTRAMEDULLARY (IM) NAIL, HIP;  Surgeon: Sheral Apley, MD;  Location: [facility] OR;  Service: Orthopedics;  Laterality: Left;    No family history on file.  Social History   Socioeconomic History   Marital status: Widowed    Spouse name: Not on file   Number of children: Not on file  Years of education: Not on file   Highest education level: Not on file  Occupational History   Not on file  Tobacco Use   Smoking status: Former    Types: Cigarettes   Smokeless tobacco: Never   Tobacco comments:    Quit around 25  Substance and Sexual Activity   Alcohol use: Not Currently   Drug use: Not on file   Sexual activity: Not on file  Other Topics Concern   Not on file  Social History Narrative   Not on file   Social Drivers of Health   Financial Resource Strain: Not on file  Food Insecurity: Not on file  Transportation Needs: Not on file  Physical Activity: Not on file  Stress: Not on file   Social Connections: Not on file  Intimate Partner Violence: Not on file    Review of Systems  Respiratory:  Positive for cough, shortness of breath and wheezing.     Vitals:   04/08/23 1101  BP: (!) 153/80  Pulse: 76  Temp: (!) 97.5 F (36.4 C)  SpO2: 96%     Physical Exam Constitutional:      Appearance: Normal appearance.  HENT:     Head: Normocephalic.     Nose: Nose normal.  Eyes:     Pupils: Pupils are equal, round, and reactive to light.  Cardiovascular:     Rate and Rhythm: Normal rate and regular rhythm.     Heart sounds: No murmur heard. Pulmonary:     Effort: No respiratory distress.     Breath sounds: No stridor. Rhonchi present.  Musculoskeletal:     Cervical back: No rigidity or tenderness.  Neurological:     Mental Status: She is alert.  Psychiatric:        Mood and Affect: Mood normal.    Data Reviewed: Recent PFT November 2024 shows moderate obstructive disease  Assessment:  COPD with exacerbation  Recently used a course of antibiotics  Will call in a course of steroids  Chest x-ray ordered, can be done in the next few days if not feeling better  Plan/Recommendations: Encouraged to keep appointment already made with Dr. Octavio Graves  Prednisone 20 mg daily for about 7 to 10 days called to pharmacy  Albuterol nebulization solution sent to pharmacy  Encouraged to call with significant concerns   [name] MD [facility] 04/08/2023, 12:33 PM  CC: Saundra Shelling, *

## 2023-07-03 ENCOUNTER — Telehealth: Payer: Self-pay

## 2023-07-03 NOTE — Telephone Encounter (Signed)
 Patient has upcoming Prolia  appointment on 07/06/23. Called ordering provider, Dr. Bambi Lever Rigby's practice [facility], [phone]) to request updated CMP results. No answer. Left voicemail on nurse line requesting call back.  Lendel Quant, RN

## 2023-07-06 ENCOUNTER — Ambulatory Visit: Payer: Medicare PPO

## 2023-07-06 ENCOUNTER — Telehealth: Payer: Self-pay

## 2023-07-06 VITALS — BP 126/74 | HR 72 | Temp 98.5°F | Resp 18 | Ht 64.0 in | Wt 114.0 lb

## 2023-07-06 DIAGNOSIS — M810 Age-related osteoporosis without current pathological fracture: Secondary | ICD-10-CM | POA: Diagnosis not present

## 2023-07-06 MED ORDER — DENOSUMAB 60 MG/ML ~~LOC~~ SOSY
60.0000 mg | PREFILLED_SYRINGE | Freq: Once | SUBCUTANEOUS | Status: AC
  Administered 2023-07-06: 60 mg via SUBCUTANEOUS

## 2023-07-06 NOTE — Progress Notes (Signed)
 Diagnosis: Osteoporosis  Provider:  Mannam, Praveen MD  Procedure: Injection  Prolia  (Denosumab ), Dose: 60 mg, Site: subcutaneous, Number of injections: 1  Injection Site(s): Right arm  Post Care:     Discharge: Condition: Good, Destination: Home . AVS Provided  Performed by:  Hiran Leard, RN

## 2023-07-06 NOTE — Telephone Encounter (Signed)
 Amy, Engineer, manufacturing at [facility], called back in response to our question regarding calcium labwork. Practice did not have updated information, but this RN spoke directly with Dr. Diona Franklin, DO who gave verbal order to proceed with Prolia  60 mg injection at patient's scheduled appointment today. Confirmed via read-back.  Lendel Quant, RN

## 2023-07-06 NOTE — Telephone Encounter (Signed)
 Error

## 2023-11-12 ENCOUNTER — Other Ambulatory Visit: Payer: Self-pay | Admitting: Internal Medicine

## 2024-01-06 ENCOUNTER — Ambulatory Visit

## 2024-01-08 ENCOUNTER — Telehealth: Payer: Self-pay | Admitting: Pharmacy Technician

## 2024-01-08 NOTE — Telephone Encounter (Signed)
 Auth Submission: PENDING WILL NEED TO UPDATE NPI Site of care: Site of care: [facility] Payer: [employer] MEDICARE Medication & CPT/J Code(s) submitted: Prolia  (Denosumab ) [id number] Diagnosis Code: M81.0 Route of submission (phone, fax, portal):  Phone # Fax # Auth type: Buy/Bill HB Units/visits requested: 60mg  q6 months Reference number:  Approval from: 01/08/24 to 03/09/24

## 2024-01-12 ENCOUNTER — Ambulatory Visit

## 2024-01-12 ENCOUNTER — Other Ambulatory Visit (HOSPITAL_COMMUNITY): Payer: Self-pay

## 2024-01-12 ENCOUNTER — Inpatient Hospital Stay (HOSPITAL_COMMUNITY): Admission: RE | Admit: 2024-01-12 | Source: Ambulatory Visit

## 2024-01-12 MED ORDER — DENOSUMAB 60 MG/ML ~~LOC~~ SOSY: 60.0000 mg | PREFILLED_SYRINGE | Freq: Once | SUBCUTANEOUS | Status: DC

## 2024-01-25 ENCOUNTER — Inpatient Hospital Stay (HOSPITAL_COMMUNITY): Admission: RE | Admit: 2024-01-25 | Source: Ambulatory Visit

## 2024-03-14 ENCOUNTER — Encounter: Payer: Self-pay | Admitting: Internal Medicine

## 2024-03-14 ENCOUNTER — Ambulatory Visit: Admitting: Internal Medicine

## 2024-03-14 VITALS — BP 129/85 | HR 79 | Temp 98.2°F | Ht 64.0 in | Wt 115.0 lb

## 2024-03-14 DIAGNOSIS — J449 Chronic obstructive pulmonary disease, unspecified: Secondary | ICD-10-CM

## 2024-03-14 DIAGNOSIS — J329 Chronic sinusitis, unspecified: Secondary | ICD-10-CM | POA: Diagnosis not present

## 2024-03-14 DIAGNOSIS — Z87891 Personal history of nicotine dependence: Secondary | ICD-10-CM

## 2024-03-14 MED ORDER — PREDNISONE 10 MG PO TABS: ORAL_TABLET | ORAL | 0 refills | Status: AC

## 2024-03-14 MED ORDER — AZITHROMYCIN 250 MG PO TABS: ORAL_TABLET | ORAL | 0 refills | Status: AC

## 2024-03-14 MED ORDER — BUDESONIDE-FORMOTEROL FUMARATE 80-4.5 MCG/ACT IN AERO: INHALATION_SPRAY | RESPIRATORY_TRACT | 12 refills | Status: AC

## 2024-03-14 NOTE — Progress Notes (Signed)
 "  Diana Potts, female    DOB: 1941-02-13   MRN: [id number]   Brief patient profile:  83 yowf quit smoking 1990s with hx of breathing problems in her  30s/40's (around 1980) eval by allergist in [place]  on shots no better  improved on Symbicort  self - referred to pulmonary clinic 10/23/2022 for GOLD COPD/AB GOLD 2 criteria in 07/2020 pfts    History of Present Illness  10/23/2022  Pulmonary/ 1st office eval/Diana Potts maint on symbicort  / singulair   Chief Complaint  Patient presents with   Consult    Dx asthma and COPD.  SOB with exertion.  Dyspnea:  variable thinks breathing problems related to sinus congestion x sev months and relatively sedentary   Cough: none now but convinced it's usually due  to contaminants in her home environment Sleep: no problem flat with one pillow  SABA use: none  Rec Pantoprazole  (protonix ) 40 mg   Take  30-60 min before first meal of the day and Pepcid  (famotidine )  20 mg after supper until return to office - this is the best way to tell whether stomach acid is contributing to your problem.   GERD diet reviewed, bed blocks rec  Plan A = Automatic = Always=    Symbicort  120 Take 2 puffs first thing in am and then another 2 puffs about 12 hours later.  Work on inhaler technique:  r  Plan B = Backup (to supplement plan A, not to replace it) Only use your albuterol  inhaler as a rescue medication  Prednisone  10 mg take  4 each am x 2 days,   2 each am x 2 days,  1 each am x 2 days and stop  Please schedule a follow up office visit in 8 weeks, sooner if needed  with all medications /inhalers/ solutions in hand   CT sinus  11/06/21 Mild-to-moderate paranasal sinus disease (no air fluid levels)    01/05/2023  f/u ov/Diana Potts re: GOLD 2 copd  maint on symb 160  did not  bring meds Chief Complaint  Patient presents with   Follow-up    Patient wants to discuss her scan today   Dyspnea:  MMRC1 = can walk nl pace, flat grade, can't hurry or go uphills or steps s sob    Cough: has sense of globus ?  A lot better p prednisone , why can't I just stay on it  Sleeping: 30 degrees/ s  resp cc occ waking up and using saba noct   SABA use: using around meals if feels getting choked  02: none  Rec Try Symbicort  80  Take 2 puffs first thing in am and then another 2 puffs about 12 hours later.  Work on inhaler technique: Prednisone  10 mg take  4 each am x 2 days,   2 each am x 2 days,  1 each am x 2 days and stop   My office will be contacting you by phone for referral to [facility]> did not do because felt better    03/14/2024  f/u ov/Diana Potts re: GOLD 2 copd    maint on symbicort   160  /  Chief Complaint  Patient presents with   COPD    Breathing about the same    Dyspnea:  MMRC 1 Cough: dry still much better when on prednisone  and also helps the globus related dysphagia no better on ppi so stopped it Sleeping: 30 degrees hob and one pillow s  resp cc  SABA use: once a daily  if chokes  on food (orppharangeal phase, esp dry crackers0  02: none    No obvious day to day or daytime variability or assoc excess/ purulent sputum or mucus plugs or hemoptysis or cp or chest tightness, subjective wheeze or overt sinus or hb symptoms.    Also denies any obvious fluctuation of symptoms with weather or environmental changes or other aggravating or alleviating factors except as outlined above   No unusual exposure hx or h/o childhood pna/ asthma or knowledge of premature birth.  Current Allergies, Complete Past Medical History, Past Surgical History, Family History, and Social History were reviewed in [employer] record.  ROS  The following are not active complaints unless bolded Hoarseness, sore throat, dysphagia with globus, dental problems, itching, sneezing,  nasal congestion or discharge of excess mucus or purulent secretions, ear ache,   fever, chills, sweats, unintended wt loss or wt gain, classically pleuritic or exertional cp,  orthopnea pnd or  arm/hand swelling  or leg swelling, presyncope, palpitations, abdominal pain, anorexia, nausea, vomiting, diarrhea  or change in bowel habits or change in bladder habits, change in stools or change in urine, dysuria, hematuria,  rash, arthralgias, visual complaints, headache, numbness, weakness or ataxia or problems with walking or coordination,  change in mood or  memory.          Outpatient Medications Prior to Visit  Medication Sig Dispense Refill   albuterol  (PROVENTIL ) (2.5 MG/3ML) 0.083% nebulizer solution Take 3 mLs (2.5 mg total) by nebulization every 6 (six) hours as needed for wheezing or shortness of breath. 75 mL 12   albuterol  (VENTOLIN  HFA) 108 (90 Base) MCG/ACT inhaler Inhale 1 puff into the lungs every 6 (six) hours as needed for wheezing or shortness of breath.     Co-Enzyme Q-10 100 MG CAPS Take 100 mg by mouth daily.     denosumab  (PROLIA ) 60 MG/ML SOSY injection Inject 60 mg into the skin every 6 (six) months.     fluticasone  (FLONASE ) 50 MCG/ACT nasal spray TWO SPRAYS TWICE DAILY EACH nostril 16 g 2   LORazepam  (ATIVAN ) 0.5 MG tablet Take 0.25 mg by mouth 2 (two) times daily as needed for anxiety or sleep.     montelukast  (SINGULAIR ) 10 MG tablet Take 10 mg by mouth at bedtime.     phenylephrine  (NEO-SYNEPHRINE) 1 % nasal spray Place 1 drop into both nostrils every 6 (six) hours as needed for congestion.     budesonide -formoterol  (SYMBICORT ) 80-4.5 MCG/ACT inhaler Take 2 puffs first thing in am and then another 2 puffs about 12 hours later. 1 each 12   bevacizumab  (AVASTIN ) 2.5 mg/0.1 mL SOLN 2.5 mg by Intravitreal route once. Every 4-5 weeks     dorzolamide -timolol  (COSOPT ) 22.3-6.8 MG/ML ophthalmic solution Place 1 drop into both eyes 2 (two) times daily.     Estradiol-Progesterone 1-100 MG CAPS Take 1 capsule by mouth daily.     FLUoxetine  (PROZAC ) 10 MG capsule Take 10 mg by mouth at bedtime.     latanoprost  (XALATAN ) 0.005 % ophthalmic solution Place 1 drop into both  eyes at bedtime.     pantoprazole  (PROTONIX ) 40 MG tablet Take 1 tablet (40 mg total) by mouth daily. Take 30-60 min before first meal of the day (Patient not taking: Reported on 03/14/2024) 30 tablet 2   azithromycin  (ZITHROMAX ) 250 MG tablet Take 1 tablet (250 mg total) by mouth as directed. 2 today, then 1 daily until gone (Patient not taking: Reported on 03/14/2024) 6 tablet  0   predniSONE  (DELTASONE ) 10 MG tablet Take 4 tabs x 2 days, 2 tabs x 2 days, then 1 tab x 2 days and stop. (Patient not taking: Reported on 03/14/2024) 14 tablet 0   predniSONE  (DELTASONE ) 10 MG tablet 4 x 2 days, 2 x 2 days, 1 x 2 days then stop (Patient not taking: Reported on 03/14/2024) 14 tablet 0   predniSONE  (DELTASONE ) 20 MG tablet Take 1 tablet (20 mg total) by mouth daily with breakfast. (Patient not taking: Reported on 03/14/2024) 10 tablet 0   No facility-administered medications prior to visit.     No past medical history on file.           Objective:    wts  03/14/2024         115  01/05/2023     119    01/02/23 119 lb 3.2 oz (54.1 kg)  10/23/22 118 lb 12.8 oz (53.9 kg)  05/22/22 117 lb 3.2 oz (53.2 kg)     Vital signs reviewed  03/14/2024  - Note at rest 02 sats  95% on RA   General appearance:    amb slt hoarse wf nad    HEENT : Oropharynx  clear   Nasal turbinates nl / ears with excess wax on R, ok ok L    NECK :  without  apparent JVD/ palpable Nodes/TM    LUNGS: no acc muscle use,  Min barrel  contour chest wall with bilateral  slightly decreased bs s audible wheeze and  without cough on insp or exp maneuvers and min  Hyperresonant  to  percussion bilaterally    CV:  RRR  no s3 or murmur or increase in P2, and no edema   ABD:  soft and nontender    MS:  Nl gait/ ext warm without deformities Or obvious joint restrictions  calf tenderness, cyanosis or clubbing     SKIN: warm and dry without lesions    NEURO:  alert, approp, nl sensorium with  no motor or cerebellar deficits apparent.              Assessment    Assessment & Plan Chronic sinusitis, unspecified location CT sinus  11/06/21 Mild-to-moderate paranasal sinus disease (no air fluid levels)  - assoc with globus noted 01/05/2023 > ENT eval requested again  03/14/2024  with assoc persistent globus sensation    COPD GOLD 2 Quit smoking 1990s - allergy testing/rx  in 20s and 40s made no difference - 10/23/2022  After extensive coaching inhaler device,  effectiveness =  75% so continue symbicort  160 2bid  - 10/23/2022   Walked on RA  x  3  lap(s) =  approx 750  ft  @ mod pace, stopped due to end of study with lowest 02 sats 95% s sob   - 01/05/2023 reduce symbicort  to the 80 strength  to see if helps lump in throat s compromising lower airway control  - PFT's  01/21/23  FEV1 1.19 (62 % ) ratio 0.53  p symbicort  prior to study    and FV curve classically concave    - 03/14/2024 try symbicort  80  due to globus) Take 2 puffs first thing in am and then another 2 puffs about 12 hours later with ENT f/u next    All goals of chronic asthma control met including optimal function and elimination of symptoms with minimal need for rescue therapy (unless choking on food )   Contingencies discussed in full including  contacting this office immediately if not controlling the symptoms using the rule of two's.     >>> try again step down to symbicort  80 2bid due to possible adverse effects on upper airways   >>>  zpak/ pred x 6 d prn flare this winter      Each maintenance medication was reviewed in detail including emphasizing most importantly the difference between maintenance and prns and under what circumstances the prns are to be triggered using an action plan format where appropriate.  Total time for H and P, chart review, counseling, reviewing neb  device(s) and generating customized AVS unique to this office visit / same day charting = 34  min            AVS  Patient Instructions  Try symbicort   80 Take 2 puffs first thing  in am and then another 2 puffs about 12 hours later.   For flare:  zpak and Prednisone  10 mg take  4 each am x 2 days,   2 each am x 2 days,  1 each am x 2 days and stop   My office will be contacting you by phone for referral to [facility]   - if you don't hear back from my office within one week please call us  back or notify us  thru MyChart and we'll address it right away.   Please schedule a follow up visit in 6  months but call sooner if needed         Ozell America, MD 03/14/2024            "

## 2024-03-14 NOTE — Assessment & Plan Note (Addendum)
 Quit smoking 1990s - allergy testing/rx  in 20s and 40s made no difference - 10/23/2022  After extensive coaching inhaler device,  effectiveness =  75% so continue symbicort  160 2bid  - 10/23/2022   Walked on RA  x  3  lap(s) =  approx 750  ft  @ mod pace, stopped due to end of study with lowest 02 sats 95% s sob   - 01/05/2023 reduce symbicort  to the 80 strength  to see if helps lump in throat s compromising lower airway control  - PFT's  01/21/23  FEV1 1.19 (62 % ) ratio 0.53  p symbicort  prior to study    and FV curve classically concave    - 03/14/2024 try symbicort  80  due to globus) Take 2 puffs first thing in am and then another 2 puffs about 12 hours later with ENT f/u next    All goals of chronic asthma control met including optimal function and elimination of symptoms with minimal need for rescue therapy (unless choking on food )   Contingencies discussed in full including contacting this office immediately if not controlling the symptoms using the rule of two's.     >>> try again step down to symbicort  80 2bid due to possible adverse effects on upper airways   >>>  zpak/ pred x 6 d prn flare this winter      Each maintenance medication was reviewed in detail including emphasizing most importantly the difference between maintenance and prns and under what circumstances the prns are to be triggered using an action plan format where appropriate.  Total time for H and P, chart review, counseling, reviewing neb  device(s) and generating customized AVS unique to this office visit / same day charting = 34  min

## 2024-03-14 NOTE — Patient Instructions (Addendum)
 Try symbicort   80 Take 2 puffs first thing in am and then another 2 puffs about 12 hours later.   For flare:  zpak and Prednisone  10 mg take  4 each am x 2 days,   2 each am x 2 days,  1 each am x 2 days and stop   My office will be contacting you by phone for referral to [facility]   - if you don't hear back from my office within one week please call us  back or notify us  thru MyChart and we'll address it right away.   Please schedule a follow up visit in 6  months but call sooner if needed

## 2024-03-14 NOTE — Assessment & Plan Note (Addendum)
 CT sinus  11/06/21 Mild-to-moderate paranasal sinus disease (no air fluid levels)  - assoc with globus noted 01/05/2023 > ENT eval requested again  03/14/2024  with assoc persistent globus sensation

## 2024-04-18 ENCOUNTER — Institutional Professional Consult (permissible substitution) (INDEPENDENT_AMBULATORY_CARE_PROVIDER_SITE_OTHER)

## 2024-07-12 ENCOUNTER — Encounter (HOSPITAL_COMMUNITY)

## 2024-07-25 ENCOUNTER — Encounter (HOSPITAL_COMMUNITY)

## 2024-08-11 ENCOUNTER — Ambulatory Visit: Admitting: Internal Medicine
